# Patient Record
Sex: Female | Born: 1980 | Race: White | Hispanic: No | Marital: Single | Smoking: Never smoker
Health system: Southern US, Community
[De-identification: ages and names within clinical notes are randomized; demographics above are authoritative.]

## PROBLEM LIST (undated history)

## (undated) DIAGNOSIS — K219 Gastro-esophageal reflux disease without esophagitis: Secondary | ICD-10-CM

## (undated) DIAGNOSIS — N719 Inflammatory disease of uterus, unspecified: Secondary | ICD-10-CM

## (undated) DIAGNOSIS — R112 Nausea with vomiting, unspecified: Secondary | ICD-10-CM

## (undated) DIAGNOSIS — D649 Anemia, unspecified: Secondary | ICD-10-CM

## (undated) DIAGNOSIS — Z9889 Other specified postprocedural states: Secondary | ICD-10-CM

## (undated) DIAGNOSIS — M329 Systemic lupus erythematosus, unspecified: Secondary | ICD-10-CM

## (undated) DIAGNOSIS — J329 Chronic sinusitis, unspecified: Secondary | ICD-10-CM

## (undated) DIAGNOSIS — IMO0002 Reserved for concepts with insufficient information to code with codable children: Secondary | ICD-10-CM

## (undated) DIAGNOSIS — I7300 Raynaud's syndrome without gangrene: Secondary | ICD-10-CM

## (undated) DIAGNOSIS — M109 Gout, unspecified: Secondary | ICD-10-CM

## (undated) DIAGNOSIS — D219 Benign neoplasm of connective and other soft tissue, unspecified: Secondary | ICD-10-CM

## (undated) DIAGNOSIS — E039 Hypothyroidism, unspecified: Secondary | ICD-10-CM

## (undated) DIAGNOSIS — M3500 Sicca syndrome, unspecified: Secondary | ICD-10-CM

## (undated) DIAGNOSIS — Z87442 Personal history of urinary calculi: Secondary | ICD-10-CM

## (undated) DIAGNOSIS — T884XXA Failed or difficult intubation, initial encounter: Secondary | ICD-10-CM

## (undated) DIAGNOSIS — Z972 Presence of dental prosthetic device (complete) (partial): Secondary | ICD-10-CM

## (undated) DIAGNOSIS — M1990 Unspecified osteoarthritis, unspecified site: Secondary | ICD-10-CM

## (undated) HISTORY — PX: MAXILLARY SINUS LIFT: SHX5206

## (undated) HISTORY — PX: ESOPHAGOGASTRODUODENOSCOPY: SHX1529

## (undated) HISTORY — PX: CARPAL TUNNEL RELEASE: SHX101

## (undated) HISTORY — PX: LAPAROSCOPY ABDOMEN DIAGNOSTIC: PRO50

## (undated) HISTORY — PX: LITHOTRIPSY: SUR834

## (undated) HISTORY — PX: SINUS EXPLORATION: SHX5214

## (undated) HISTORY — PX: COLONOSCOPY: SHX174

## (undated) HISTORY — PX: KNEE DISLOCATION SURGERY: SHX689

---
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
## 2007-05-20 HISTORY — PX: CARPAL TUNNEL RELEASE: SHX101

## 2013-02-23 LAB — URINALYSIS, COMPLETE
Bilirubin,UR: NEGATIVE
Ketone: NEGATIVE
Nitrite: NEGATIVE
Specific Gravity: 1.024 (ref 1.003–1.030)
Squamous Epithelial: 4

## 2013-02-23 LAB — CBC
HGB: 15.7 g/dL (ref 12.0–16.0)
MCH: 29.5 pg (ref 26.0–34.0)
MCHC: 33.5 g/dL (ref 32.0–36.0)
Platelet: 266 10*3/uL (ref 150–440)
RBC: 5.31 10*6/uL — ABNORMAL HIGH (ref 3.80–5.20)
WBC: 11.9 10*3/uL — ABNORMAL HIGH (ref 3.6–11.0)

## 2013-02-23 LAB — BASIC METABOLIC PANEL
BUN: 22 mg/dL — ABNORMAL HIGH (ref 7–18)
Co2: 25 mmol/L (ref 21–32)
EGFR (African American): >60
EGFR (Non-African Amer.): >60
Potassium: 3.6 mmol/L (ref 3.5–5.1)
Sodium: 137 mmol/L (ref 136–145)

## 2013-02-23 LAB — PREGNANCY, URINE: Pregnancy Test, Urine: NEGATIVE m[IU]/mL

## 2013-02-24 ENCOUNTER — Observation Stay: Payer: Self-pay | Admitting: Internal Medicine

## 2013-02-24 LAB — URINALYSIS, COMPLETE
Bilirubin,UR: NEGATIVE
Glucose,UR: 50 mg/dL (ref 0–75)
Ketone: NEGATIVE
Ph: 5 (ref 4.5–8.0)
RBC,UR: 1 /HPF (ref 0–5)
Specific Gravity: 1.011 (ref 1.003–1.030)
Squamous Epithelial: 1
WBC UR: <1 /HPF (ref 0–5)

## 2013-02-25 LAB — CBC WITH DIFFERENTIAL/PLATELET
Eosinophil #: 0.1 10*3/uL (ref 0.0–0.7)
Eosinophil %: 0.8 %
HCT: 36.1 % (ref 35.0–47.0)
HGB: 12.5 g/dL (ref 12.0–16.0)
Lymphocyte #: 1.1 10*3/uL (ref 1.0–3.6)
Lymphocyte %: 11.7 %
MCH: 30.2 pg (ref 26.0–34.0)
MCHC: 34.7 g/dL (ref 32.0–36.0)
MCV: 87 fL (ref 80–100)
Monocyte #: 0.7 x10 3/mm (ref 0.2–0.9)
Platelet: 155 10*3/uL (ref 150–440)
RDW: 13.8 % (ref 11.5–14.5)

## 2013-02-25 LAB — URINE CULTURE

## 2013-03-01 ENCOUNTER — Encounter (HOSPITAL_COMMUNITY): Payer: Self-pay | Admitting: Emergency Medicine

## 2013-03-01 ENCOUNTER — Emergency Department (HOSPITAL_COMMUNITY)
Admission: EM | Admit: 2013-03-01 | Discharge: 2013-03-02 | Disposition: A | Discharge status: Home / Self Care | Payer: 59 | Attending: Emergency Medicine | Admitting: Emergency Medicine

## 2013-03-01 DIAGNOSIS — Z79899 Other long term (current) drug therapy: Secondary | ICD-10-CM | POA: Insufficient documentation

## 2013-03-01 DIAGNOSIS — H53149 Visual discomfort, unspecified: Secondary | ICD-10-CM | POA: Insufficient documentation

## 2013-03-01 DIAGNOSIS — R519 Headache, unspecified: Secondary | ICD-10-CM

## 2013-03-01 DIAGNOSIS — Z3202 Encounter for pregnancy test, result negative: Secondary | ICD-10-CM | POA: Insufficient documentation

## 2013-03-01 DIAGNOSIS — Z885 Allergy status to narcotic agent status: Secondary | ICD-10-CM | POA: Insufficient documentation

## 2013-03-01 DIAGNOSIS — R0789 Other chest pain: Secondary | ICD-10-CM | POA: Insufficient documentation

## 2013-03-01 DIAGNOSIS — R109 Unspecified abdominal pain: Secondary | ICD-10-CM | POA: Insufficient documentation

## 2013-03-01 DIAGNOSIS — M546 Pain in thoracic spine: Secondary | ICD-10-CM | POA: Insufficient documentation

## 2013-03-01 DIAGNOSIS — Z8742 Personal history of other diseases of the female genital tract: Secondary | ICD-10-CM | POA: Insufficient documentation

## 2013-03-01 DIAGNOSIS — M329 Systemic lupus erythematosus, unspecified: Secondary | ICD-10-CM | POA: Insufficient documentation

## 2013-03-01 DIAGNOSIS — M549 Dorsalgia, unspecified: Secondary | ICD-10-CM

## 2013-03-01 DIAGNOSIS — R51 Headache: Secondary | ICD-10-CM | POA: Insufficient documentation

## 2013-03-01 DIAGNOSIS — M129 Arthropathy, unspecified: Secondary | ICD-10-CM | POA: Insufficient documentation

## 2013-03-01 DIAGNOSIS — Z87440 Personal history of urinary (tract) infections: Secondary | ICD-10-CM | POA: Insufficient documentation

## 2013-03-01 HISTORY — DX: Reserved for concepts with insufficient information to code with codable children: IMO0002

## 2013-03-01 HISTORY — DX: Inflammatory disease of uterus, unspecified: N71.9

## 2013-03-01 HISTORY — DX: Unspecified osteoarthritis, unspecified site: M19.90

## 2013-03-01 HISTORY — DX: Systemic lupus erythematosus, unspecified: M32.9

## 2013-03-01 HISTORY — DX: Benign neoplasm of connective and other soft tissue, unspecified: D21.9

## 2013-03-01 LAB — URINE MICROSCOPIC-ADD ON

## 2013-03-01 LAB — URINALYSIS, ROUTINE W REFLEX MICROSCOPIC
Glucose, UA: NEGATIVE mg/dL
Specific Gravity, Urine: 1.018 (ref 1.005–1.030)
pH: 5 (ref 5.0–8.0)

## 2013-03-01 LAB — POCT PREGNANCY, URINE: Preg Test, Ur: NEGATIVE

## 2013-03-01 NOTE — ED Notes (Signed)
Pt states that she has been seen multiple times for a UTI with no usual symptoms. Pt states that she was even admitted at [facility] for a night with a kidney infection then was given oral antibiotics for a week and finished the antibiotics Monday. Pt states that she started having back pain on Sunday.

## 2013-03-01 NOTE — ED Notes (Signed)
Pt. Upset for the wait time.  Explained we are doing the best we can with our pstients and getting them back.

## 2013-03-02 LAB — CBC WITH DIFFERENTIAL/PLATELET
Basophils Absolute: 0 10*3/uL (ref 0.0–0.1)
Basophils Relative: 0 % (ref 0–1)
HCT: 40.8 % (ref 36.0–46.0)
Hemoglobin: 14.5 g/dL (ref 12.0–15.0)
Lymphs Abs: 0.7 10*3/uL (ref 0.7–4.0)
MCH: 30.7 pg (ref 26.0–34.0)
MCHC: 35.5 g/dL (ref 30.0–36.0)
Monocytes Absolute: 0.2 10*3/uL (ref 0.1–1.0)
Neutro Abs: 8.2 10*3/uL — ABNORMAL HIGH (ref 1.7–7.7)
RDW: 13.8 % (ref 11.5–15.5)

## 2013-03-02 LAB — BASIC METABOLIC PANEL
Calcium: 9.1 mg/dL (ref 8.4–10.5)
Chloride: 102 mEq/L (ref 96–112)
Creatinine, Ser: 0.8 mg/dL (ref 0.50–1.10)
GFR calc Af Amer: >90 mL/min (ref >90)
GFR calc non Af Amer: >90 mL/min (ref >90)
Sodium: 139 mEq/L (ref 135–145)

## 2013-03-02 MED ORDER — MORPHINE SULFATE 4 MG/ML IJ SOLN
4.0000 mg | Freq: Once | INTRAMUSCULAR | Status: AC
  Administered 2013-03-02: 4 mg via INTRAVENOUS
  Filled 2013-03-02: qty 1

## 2013-03-02 MED ORDER — HYDROCODONE-ACETAMINOPHEN 5-325 MG PO TABS: 2.0000 | ORAL_TABLET | Freq: Four times a day (QID) | ORAL | Status: DC | PRN

## 2013-03-02 MED ORDER — SODIUM CHLORIDE 0.9 % IV BOLUS (SEPSIS)
1000.0000 mL | Freq: Once | INTRAVENOUS | Status: AC
  Administered 2013-03-02: 1000 mL via INTRAVENOUS

## 2013-03-02 MED ORDER — ONDANSETRON HCL 4 MG PO TABS: 4.0000 mg | ORAL_TABLET | Freq: Four times a day (QID) | ORAL | Status: DC

## 2013-03-02 MED ORDER — ONDANSETRON HCL 4 MG/2ML IJ SOLN
4.0000 mg | Freq: Once | INTRAMUSCULAR | Status: AC
  Administered 2013-03-02: 4 mg via INTRAVENOUS
  Filled 2013-03-02: qty 2

## 2013-03-02 NOTE — ED Notes (Signed)
Pt crying in room stating that she still has a headache and the morphine did not help. Spoke with pt after talking w Dahlia Client PA and offered to try a different type of pain medication - pt refused. Pt stated several times that she did not want a different type of pain medication. Informed Dahlia Client PA

## 2013-03-02 NOTE — ED Provider Notes (Signed)
CSN: [id number]     Arrival date & time 03/01/13  1958 History   First MD Initiated Contact with Patient 03/02/13 0008     Chief Complaint  Patient presents with  . Urinary Tract Infection  . Back Pain   (Consider location/radiation/quality/duration/timing/severity/associated sxs/prior Treatment) HPI Comments: Patient is a 32 year old female who presents today with worsening back pain. She was recently admitted to [facility] for a pyelonephritis. Prior to her admission she was seen at [facility] and diagnosed with a UTI. She was given 5 days of Cipro which did not help and she was told at [facility] she was resistant to Cipro. Her hospital course at [facility] was uneventful and she was discharged home Thursday. She was sent home on Keflex and toradol. The Toradol has not helped her pain at all. She only takes the Toradol at night. She finished her Keflex on Monday. On Sunday her symptoms began to worsen. She had a gradually worsening, throbbing frontal headache that developed on Saturday. It is associated with mild photophobia. No visual disturbance. She also has bilateral thoracic back pain. This feels like the same pain she had when she had her pyelonephritis. She denies fever, chills, nausea, vomiting, new abdominal pain. She has fibroids and has had the same abdominal pain for years.   The history is provided by the patient. No language interpreter was used.    Past Medical History  Diagnosis Date  . Lupus   . Fibroids   . Arthritis   . Endometritis    Past Surgical History  Procedure Laterality Date  . Carpal tunnel release     No family history on file. History  Substance Use Topics  . Smoking status: Never Smoker   . Smokeless tobacco: Not on file  . Alcohol Use: No   OB History   Grav Para Term Preterm Abortions TAB SAB Ect Mult Living                 Review of Systems  Constitutional: Negative for fever and chills.  Eyes: Positive for photophobia. Negative for visual  disturbance.  Respiratory: Negative for shortness of breath.   Gastrointestinal: Positive for abdominal pain. Negative for nausea and vomiting.  Musculoskeletal: Positive for back pain.  Neurological: Positive for headaches.  All other systems reviewed and are negative.    Allergies  Codeine  Home Medications   Current Outpatient Rx  Name  Route  Sig  Dispense  Refill  . cephALEXin (KEFLEX) 500 MG capsule   Oral   Take 500 mg by mouth 3 (three) times daily.         . hydroxychloroquine (PLAQUENIL) 200 MG tablet   Oral   Take 200 mg by mouth 2 (two) times daily.         Marland Kitchen leflunomide (ARAVA) 10 MG tablet   Oral   Take 10 mg by mouth every evening.         . norethindrone (AYGESTIN) 5 MG tablet   Oral   Take 5 mg by mouth 2 (two) times daily.         . phenazopyridine (PYRIDIUM) 200 MG tablet   Oral   Take 200 mg by mouth 3 (three) times daily.         . potassium citrate (UROCIT-K) 10 MEQ (1080 MG) SR tablet   Oral   Take 20 mEq by mouth 2 (two) times daily.         Marland Kitchen sulfaDIAZINE 500 MG tablet   Oral  Take 1,000 mg by mouth 2 (two) times daily.          BP 115/88  Pulse 92  Temp(Src) 98.3 F (36.8 C) (Oral)  Resp 18  Ht 5\' 1"  (1.549 m)  Wt 135 lb 6 oz (61.406 kg)  BMI 25.59 kg/m2  SpO2 98% Physical Exam  Nursing note and vitals reviewed. Constitutional: She is oriented to person, place, and time. She appears well-developed and well-nourished. No distress.  HENT:  Head: Normocephalic and atraumatic.  Right Ear: External ear normal.  Left Ear: External ear normal.  Nose: Nose normal.  Mouth/Throat: Oropharynx is clear and moist.  Eyes: Conjunctivae and EOM are normal. Pupils are equal, round, and reactive to light.  Neck: Trachea normal, normal range of motion and phonation normal.  No nuchal rigidity or meningeal signs  Cardiovascular: Normal rate, regular rhythm and normal heart sounds.   Pulmonary/Chest: Effort normal and breath  sounds normal. No stridor. No respiratory distress. She has no wheezes. She has no rales.  Abdominal: Soft. She exhibits no distension. There is tenderness in the periumbilical area. There is no rigidity, no rebound and no guarding.  Pt reports abd pain is chronic  Musculoskeletal: Normal range of motion.       Back:  Neurological: She is alert and oriented to person, place, and time. She has normal strength. No sensory deficit. Coordination normal.  Skin: Skin is warm and dry. She is not diaphoretic. No erythema.  Psychiatric: She has a normal mood and affect. Her behavior is normal.    ED Course  Procedures (including critical care time) Labs Review Labs Reviewed  URINALYSIS, ROUTINE W REFLEX MICROSCOPIC - Abnormal; Notable for the following:    APPearance CLOUDY (*)    Hgb urine dipstick SMALL (*)    All other components within normal limits  CBC WITH DIFFERENTIAL - Abnormal; Notable for the following:    Neutrophils Relative % 90 (*)    Neutro Abs 8.2 (*)    Lymphocytes Relative 7 (*)    Monocytes Relative 2 (*)    All other components within normal limits  URINE MICROSCOPIC-ADD ON  BASIC METABOLIC PANEL  POCT PREGNANCY, URINE   Imaging Review No results found.  EKG Interpretation   None      1:48 AM Patient was reevaluated prior to discharge. Morphine and fluids improved back pain, but not headache. Patient refused further medication to improve headache. Patient unhappy and does not believe that her pain is not related to kidney.   MDM   1. Back pain   2. Headache    Patient presents with back pain and headache after being discharged from [place] with pyelonephritis. Symptoms began to worsen over past few days. Completed abx as prescribed. Today UA is clear with only a small amount of blood. No leukocytes or nitrates. Afebrile. No white count. CBC and BMP are unremarkable. Creatinine is not elevated. Patient is tender to palpation over mid back bilaterally. She is  unhappy that there is nothing we can do for her kidneys as she believes this the source of infection. Will d/c home with norco and zofran for back pain. Pt HA treated while in ED with little improvement. Patient refused further treatment.  Presentation is non concerning for [facility], ICH, Meningitis, or temporal arteritis. Pt is afebrile with no focal neuro deficits, nuchal rigidity, or change in vision. Pt is to follow up with PCP. Pt verbalizes understanding and is agreeable with plan to dc. Discussed case with Dr.  Optiz who agrees with plan. Return instructions given. Vital signs stable for discharge. Patient / Family / Caregiver informed of clinical course, understand medical decision-making process, and agree with plan.      Mora Bellman, PA-C 03/02/13 [phone]

## 2013-03-02 NOTE — ED Notes (Signed)
Pt states that she was admitted to [facility] Wednesday and dc'd Friday night and sent home w antibiotics until Monday. Pt states that she started having back pain and headache on Sunday. Pt states that she felt better when she was on the IV antibiotics.

## 2013-03-02 NOTE — ED Provider Notes (Signed)
Medical screening examination/treatment/procedure(s) were conducted as a shared visit with non-physician practitioner(s) and myself.  I personally evaluated the patient during the encounter  Patient with multiple complaints, she is mostly concerned that she has ongoing urinary tract infection 2 to persistent back pain. Chest tenderness over the parathoracic region without any midline tenderness or deformity. No CVA tenderness. Heart regular rate and rhythm and lungs clear to auscultation. No neuro deficits. No abdominal tenderness. Medications provided and patient initially states feeling better, when she was told that a urinalysis and labs are unrevealing for ongoing infection, she became very upset. Patient was offered further medications for headache and back pain. She declines and requests to be discharged. No indication for further workup or admission at this time.  Plan continue antibiotics as prescribed and followup with her doctors in [place].  Results for orders placed during the hospital encounter of 03/01/13  URINALYSIS, ROUTINE W REFLEX MICROSCOPIC      Result Value Range   Color, Urine YELLOW  YELLOW   APPearance CLOUDY (*) CLEAR   Specific Gravity, Urine 1.018  1.005 - 1.030   pH 5.0  5.0 - 8.0   Glucose, UA NEGATIVE  NEGATIVE mg/dL   Hgb urine dipstick SMALL (*) NEGATIVE   Bilirubin Urine NEGATIVE  NEGATIVE   Ketones, ur NEGATIVE  NEGATIVE mg/dL   Protein, ur NEGATIVE  NEGATIVE mg/dL   Urobilinogen, UA 0.2  0.0 - 1.0 mg/dL   Nitrite NEGATIVE  NEGATIVE   Leukocytes, UA NEGATIVE  NEGATIVE  URINE MICROSCOPIC-ADD ON      Result Value Range   Squamous Epithelial / LPF RARE  RARE   WBC, UA 0-2  <3 WBC/hpf   RBC / HPF 3-6  <3 RBC/hpf   Bacteria, UA RARE  RARE  CBC WITH DIFFERENTIAL      Result Value Range   WBC 9.1  4.0 - 10.5 K/uL   RBC 4.72  3.87 - 5.11 MIL/uL   Hemoglobin 14.5  12.0 - 15.0 g/dL   HCT 16.1  09.6 - 04.5 %   MCV 86.4  78.0 - 100.0 fL   MCH 30.7  26.0 - 34.0  pg   MCHC 35.5  30.0 - 36.0 g/dL   RDW 40.9  81.1 - 91.4 %   Platelets 170  150 - 400 K/uL   Neutrophils Relative % 90 (*) 43 - 77 %   Neutro Abs 8.2 (*) 1.7 - 7.7 K/uL   Lymphocytes Relative 7 (*) 12 - 46 %   Lymphs Abs 0.7  0.7 - 4.0 K/uL   Monocytes Relative 2 (*) 3 - 12 %   Monocytes Absolute 0.2  0.1 - 1.0 K/uL   Eosinophils Relative 0  0 - 5 %   Eosinophils Absolute 0.0  0.0 - 0.7 K/uL   Basophils Relative 0  0 - 1 %   Basophils Absolute 0.0  0.0 - 0.1 K/uL  BASIC METABOLIC PANEL      Result Value Range   Sodium 139  135 - 145 mEq/L   Potassium 4.2  3.5 - 5.1 mEq/L   Chloride 102  96 - 112 mEq/L   CO2 23  19 - 32 mEq/L   Glucose, Bld 96  70 - 99 mg/dL   BUN 18  6 - 23 mg/dL   Creatinine, Ser 7.82  0.50 - 1.10 mg/dL   Calcium 9.1  8.4 - 95.6 mg/dL   GFR calc non Af Amer >90  >90 mL/min   GFR calc  Af Amer >90  >90 mL/min  POCT PREGNANCY, URINE      Result Value Range   Preg Test, Ur NEGATIVE  NEGATIVE        Sunnie Nielsen, MD 03/02/13 [phone]

## 2013-03-08 ENCOUNTER — Encounter: Payer: Self-pay | Admitting: Family Medicine

## 2013-03-08 ENCOUNTER — Ambulatory Visit (INDEPENDENT_AMBULATORY_CARE_PROVIDER_SITE_OTHER): Payer: 59 | Admitting: Family Medicine

## 2013-03-08 VITALS — BP 124/78 | HR 123 | Ht 61.0 in | Wt 136.0 lb

## 2013-03-08 DIAGNOSIS — N809 Endometriosis, unspecified: Secondary | ICD-10-CM | POA: Insufficient documentation

## 2013-03-08 DIAGNOSIS — N200 Calculus of kidney: Secondary | ICD-10-CM

## 2013-03-08 DIAGNOSIS — C490 Malignant neoplasm of connective and soft tissue of head, face and neck: Secondary | ICD-10-CM

## 2013-03-08 DIAGNOSIS — M329 Systemic lupus erythematosus, unspecified: Secondary | ICD-10-CM

## 2013-03-08 DIAGNOSIS — M069 Rheumatoid arthritis, unspecified: Secondary | ICD-10-CM | POA: Insufficient documentation

## 2013-03-08 DIAGNOSIS — M3500 Sicca syndrome, unspecified: Secondary | ICD-10-CM

## 2013-03-08 NOTE — Patient Instructions (Signed)
Hysterectomy Information  A hysterectomy is a procedure where your uterus is surgically removed. It will no longer be possible to have menstrual periods or to become pregnant. The tubes and ovaries can be removed (bilateral salpingo-oopherectomy) during this surgery as well.  REASONS FOR A HYSTERECTOMY  Persistent, abnormal bleeding.  Lasting (chronic) pelvic pain or infection.  The lining of the uterus (endometrium) starts growing outside the uterus (endometriosis).  The endometrium starts growing in the muscle of the uterus (adenomyosis).  The uterus falls down into the vagina (pelvic organ prolapse).  Symptomatic uterine fibroids.  Precancerous cells.  Cervical cancer or uterine cancer. TYPES OF HYSTERECTOMIES  Supracervical hysterectomy. This type removes the top part of the uterus, but not the cervix.  Total hysterectomy. This type removes the uterus and cervix.  Radical hysterectomy. This type removes the uterus, cervix, and the fibrous tissue that holds the uterus in place in the pelvis (parametrium). WAYS A HYSTERECTOMY CAN BE PERFORMED  Abdominal hysterectomy. A large surgical cut (incision) is made in the abdomen. The uterus is removed through this incision.  Vaginal hysterectomy. An incision is made in the vagina. The uterus is removed through this incision. There are no abdominal incisions.  Conventional laparoscopic hysterectomy. A thin, lighted tube with a camera (laparoscope) is inserted into 3 or 4 small incisions in the abdomen. The uterus is cut into small pieces. The small pieces are removed through the incisions, or they are removed through the vagina.  Laparoscopic assisted vaginal hysterectomy (LAVH). Three or four small incisions are made in the abdomen. Part of the surgery is performed laparoscopically and part vaginally. The uterus is removed through the vagina.  Robot-assisted laparoscopic hysterectomy. A laparoscope is inserted into 3 or 4 small  incisions in the abdomen. A computer-controlled device is used to give the surgeon a 3D image. This allows for more precise movements of surgical instruments. The uterus is cut into small pieces and removed through the incisions or removed through the vagina. RISKS OF HYSTERECTOMY   Bleeding and risk of blood transfusion. Tell your caregiver if you do not want to receive any blood products.  Blood clots in the legs or lung.  Infection.  Injury to surrounding organs.  Anesthesia problems or side effects.  Conversion to an abdominal hysterectomy. WHAT TO EXPECT AFTER A HYSTERECTOMY  You will be given pain medicine.  You will need to have someone with you for the first 3 to 5 days after you go home.  You will need to follow up with your surgeon in 2 to 4 weeks after surgery to evaluate your progress.  You may have early menopause symptoms like hot flashes, night sweats, and insomnia.  If you had a hysterectomy for a problem that was not a cancer or a condition that could lead to cancer, then you no longer need Pap tests. However, even if you no longer need a Pap test, a regular exam is a good idea to make sure no other problems are starting. Document Released: 10/29/2000 Document Revised: 07/28/2011 Document Reviewed: 12/14/2010 [facility] Patient Information 2014 [place], [place]. Endometriosis Endometriosis is a disease that occurs when the endometrium (lining of the uterus) is misplaced outside of its normal location. It may occur in many locations close to the uterus (womb), but commonly on the ovaries, fallopian tubes, vagina (birth canal) and bowel located close to the uterus. Because the uterus sloughs (expels) its lining every month (menses), there is bleeding whereever the endometrial tissue is located. SYMPTOMS  Often  there are no symptoms. However, because blood is irritating to tissues not normally exposed to it, when symptoms occur they vary with the location of the misplaced  endometrium. Symptoms often include back and abdominal pain. Periods may be heavier and intercourse may be painful. Infertility may be present. You may have all of these symptoms at one time or another or you may have months with no symptoms at all. Although the symptoms occur mainly during menses, they can occur mid-cycle as well, and usually terminate with menopause. DIAGNOSIS  Your caregiver may recommend a blood test and urine test (urinalysis) to help rule out other conditions. Another common test is ultrasound, a painless procedure that uses sound waves to make a sonogram "picture" of abnormal tissue that could be endometriosis. If your bowel movements are painful around your periods, your caregiver may advise a barium enema (an X-ray of the lower bowel), to try to find the source of your pain. This is sometimes confirmed by laparoscopy. Laparoscopy is a procedure where your caregiver looks into your abdomen with a laparoscope (a small pencil sized telescope). Your caregiver may take a tiny piece of tissue (biopsy) from any abnormal tissue to confirm or document your problem. These tissues are sent to the lab and a pathologist looks at them under the microscope to give a microscopic diagnosis. TREATMENT  Once the diagnosis is made, it can be treated by destruction of the misplaced endometrial tissue using heat (diathermy), laser, cutting (excision), or chemical means. It may also be treated with hormonal therapy. When using hormonal therapy menses are eliminated, therefore eliminating the monthly exposure to blood by the misplaced endometrial tissue. Only in severe cases is it necessary to perform a hysterectomy with removal of the tubes, uterus and ovaries. HOME CARE INSTRUCTIONS   Only take over-the-counter or prescription medicines for pain, discomfort, or fever as directed by your caregiver.  Avoid activities that produce pain, including a physical sexual relationship.  Do not take aspirin as  this may increase bleeding when not on hormonal therapy.  See your caregiver for pain or problems not controlled with treatment. SEEK IMMEDIATE MEDICAL CARE IF:   Your pain is severe and is not responding to pain medicine.  You develop severe nausea and vomiting, or you cannot keep foods down.  Your pain localizes to the right lower part of your abdomen (possible appendicitis).  You have swelling or increasing pain in the abdomen.  You have a fever.  You see blood in your stool. MAKE SURE YOU:   Understand these instructions.  Will watch your condition.  Will get help right away if you are not doing well or get worse. Document Released: 05/02/2000 Document Revised: 07/28/2011 Document Reviewed: 12/22/2007 [facility] Patient Information 2014 [place], [place].

## 2013-03-08 NOTE — Assessment & Plan Note (Signed)
>  25 min discussion had with pt. About pros and cons of surgery.  Discussed definitive treatment surgical +/- removal of ovaries, open vs. Robotic, possible injury to surrounding organs given previous surgical findings, difficulty healing related to chronic immunosuppression, menopause, osteoporosis.  Discussed alternative therapies including IUD vs. continuous OC's vs. Continuing or increasing her aygestin.  She will think this over and let us know what she decides to do.

## 2013-03-09 NOTE — Progress Notes (Signed)
Subjective:    Patient ID: Barbara Williamson, female    DOB: 03/16/1981, 32 y.o.   MRN: [id number]  HPI New pt. Here today for second opinion.  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  Surviving that, menses began at age 37 and have been irregular.  Only lasting 2-3 days but painful.  Has h/o SLE, RA and Sjogren's, on some immunosuppression and has pain in joints mostly, possibly some liver involvement.  Has h/o nephrolithiasis and during an attack, had CT which showed large ovarian cyst.  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  She was given Depo-Lupron for one year, followed by continuous OC's.  Noted pain returned after this.  Subsequently had reduction  In lower abdominal pain, and then moved here from [place].  Saw MD at [facility] and underwent pelvic sonography, which showed resolution of ovarian cyst and few fibroids. Last 2 cycles in Sept and October have also had heavy vaginal bleeding.  She was then started on Aygestin. Pain continues and she is wondering about definitive treatment.  She has a lot of pain and would like some pain to be eliminated if possible. Pt. Is an excellent historian and I have reviewed notes from [place].  Past Medical History  Diagnosis Date  . Lupus   . Fibroids   . Arthritis   . Endometritis    Past Surgical History  Procedure Laterality Date  . Carpal tunnel release    . Laparoscopy abdomen diagnostic    . Tumor removal Left 1986    rhabdomyosarcoma  . Cataract extraction w/ intraocular lens implant Left 1989  . Maxillary sinus lift      x2  . Sinus exploration Left     x 2  . Knee dislocation surgery Right     X 2  . Lithotripsy     Current Outpatient Prescriptions on File  Prior to Visit  Medication Sig Dispense Refill  . hydroxychloroquine (PLAQUENIL) 200 MG tablet Take 200 mg by mouth 2 (two) times daily.      Marland Kitchen leflunomide (ARAVA) 10 MG tablet Take 10 mg by mouth every evening.      . norethindrone (AYGESTIN) 5 MG tablet Take 5 mg by mouth 2 (two) times daily.      . potassium citrate (UROCIT-K) 10 MEQ (1080 MG) SR tablet Take 20 mEq by mouth 2 (two) times daily.      Marland Kitchen sulfaDIAZINE 500 MG tablet Take 1,000 mg by mouth 2 (two) times daily.      Marland Kitchen HYDROcodone-acetaminophen (NORCO/VICODIN) 5-325 MG per tablet Take 2 tablets by mouth every 6 (six) hours as needed for pain.  12 tablet  0   No current facility-administered medications on file prior to visit.   Allergies  Allergen Reactions  . Codeine Nausea And Vomiting   Family History  Problem Relation Age of Onset  . Cancer Maternal Aunt     breast   History   Social History  . Marital Status: Single    Spouse Name: N/A    Number of Children: N/A  . Years of Education: N/A   Occupational History  . Not on file.   Social History Main Topics  . Smoking status: Never Smoker   . Smokeless tobacco: Never Used  .  Alcohol Use: No  . Drug Use: No  . Sexual Activity: No   Other Topics Concern  . Not on file   Social History Narrative  . No narrative on file     Review of Systems  Constitutional: Positive for fatigue. Negative for fever and chills.  Respiratory: Negative for shortness of breath and wheezing.   Cardiovascular: Negative for chest pain and leg swelling.  Gastrointestinal: Positive for abdominal pain. Negative for nausea, vomiting, diarrhea and constipation.  Endocrine: Negative for cold intolerance and heat intolerance.  Genitourinary: Positive for vaginal bleeding, menstrual problem and pelvic pain. Negative for dysuria.  Musculoskeletal: Positive for arthralgias.  Skin: Negative for rash.  Neurological: Negative for dizziness, seizures and speech difficulty.   Psychiatric/Behavioral: Negative for behavioral problems and agitation.       Objective:   Physical Exam  Vitals reviewed. Constitutional: She appears well-developed and well-nourished. No distress.  HENT:  Head: Normocephalic and atraumatic.  Eyes: No scleral icterus.  Neck: Neck supple.  Cardiovascular: Normal rate.   Pulmonary/Chest: Effort normal.  Abdominal: Soft. There is no tenderness.  Musculoskeletal:  She has facial asymmetry related to previous resection and XRT  Neurological: She is alert.  Skin: Skin is warm.  Psychiatric: She has a normal mood and affect.          Assessment & Plan:

## 2013-03-29 HISTORY — PX: ABDOMINAL HYSTERECTOMY: SHX81

## 2014-09-08 NOTE — Discharge Summary (Signed)
PATIENT NAME:  Barbara Williamson, Barbara Williamson MR#:  [id number] DATE OF BIRTH:  02-21-1981  DATE OF ADMISSION:  02/24/2013 DATE OF DISCHARGE:  02/25/2013  ADMITTING DIAGNOSIS: Urinary tract infection.   DISCHARGE DIAGNOSES: 1.  Suspected acute pyelonephritis. Cultures unfortunately contamination only.  2.  Hypotension, resolved on intravenous fluids. 3.  Leukocytosis, resolved.  4.  Chronic abdominal pain, likely due to uterine fibroids as well as endometriosis. 5.  History of kidney stones, nonobstructing.  6.  Recent exacerbation of lupus, rheumatoid arthritis.   DISCHARGE CONDITION: Stable.   DISCHARGE MEDICATIONS: The patient is to continue Plaquenil 200 mg p.o. twice daily, leflunomide 20 mg p.o. daily, norethindrone 5 mg once daily, prednisone 10 mg p.o. daily, potassium citrate 2 tablets twice daily, Prilosec 20 mg p.o. twice daily.   NEW MEDICATIONS: Acetaminophen/hydrocodone 325/5 two tablets every 4 hours as needed, Pyridium 100 mg p.o. 3 times daily after meals for 2 more days and Keflex 500 mg p.o. 3 times daily for 5 more days.   HOME OXYGEN: None.   DIET: Regular, regular consistency.   ACTIVITY LIMITATIONS: As tolerated.   FOLLOWUP APPOINTMENT: With primary care physician at [facility] in [facility] in 2 days after discharge. The patient will be also scheduled a followup appointment with Dr. Enzo Bi in 1 to 2 weeks after discharge, as well as Dr. Jacqlyn Larsen in the next available in the next 1 to 2 weeks after discharge.   CONSULTANTS: Care management, social work.   RADIOLOGIC STUDIES: CT scan of abdomen and pelvis with contrast, 02/23/2013, revealed findings likely representing multiple uterine leiomyomata, no definite evidence of obstructive or inflammatory abnormalities. Nonobstructing renal calculi on the right were noted.   The patient is a 34 year old Caucasian female with past medical history significant for history of rheumatoid arthritis, history of lupus, who presented to  the hospital with complaints of dysuria. Please refer to Dr. Samantha Crimes admission note on 02/24/2013. Apparently, the patient was diagnosed with a urinary tract infection approximately 1 week ago. She was given Cipro p.o. and despite her taking medications, she continued to have dysuria as well as increased urinary frequency and bilateral flank and suprapubic pains, so she presented to the hospital for further evaluation. In the Emergency Room, her vital signs: Temperature was 98. Pulse was 84. Respiratory rate was 16, blood pressure 111/74. O2 sats were 99% on room air. Physical exam did not show any significant abnormalities. Mild guarding was noted in suprapubic region.   The patient's lab data done on admission, 02/23/2013, revealed mild elevation of BUN to 22. Otherwise BMP was normal. The patient's white blood cell count was also slightly elevated at 11.9. Hemoglobin was 15.7 and platelet count was 266. Urinalysis revealed yellow hazy urine, negative for glucose, bilirubin or ketones. Specific gravity was 1.024; pH was 5.0, 2+ blood, 30 mg/dL protein, negative for nitrites; 2+ leukocyte esterase, 57 red blood cells, 78 white blood cells were noted, no bacteria; 4 epithelial cells as well as mucus was present. Urine cultures revealed mixed bacterial organisms, results suggestive of contamination. Pregnancy test in urine was negative.   The hospital was admitted to the hospital for further evaluation. She was started on Rocephin IV and with this, the patient's condition somewhat improved. She was also given some pain medications for abdominal discomfort as well as Pyridium. By 02/25/2013, she felt satisfactory and was okay to be discharged home. Her vitals remained stable with temperature of 98.2. Pulse was 70s to 80s. Respiratory rate was 18, blood pressure ranging  from 829 to 562 systolic and 13Y diastolic, and O2 sats were 94% to 96% on room air at rest. The patient was advised to continue antibiotic therapy  for 5 more days and follow up with her primary care physician for further recommendations. The patient was noted to be hypotensive initially on arrival to the hospital. She was given IV fluids and her blood pressure improved. In regards to leukocytosis, with antibiotic therapy, the patient's leukocytosis resolved. For chronic abdominal pains, it was felt to be due to uterine fibroids as well as endometriosis. The patient will be scheduled an appointment with Dr. Enzo Bi as outpatient. In regards to kidney stones, the patient will be scheduled also with a urologist as outpatient. No obstruction was noted. The patient is being discharged in stable condition with the above-mentioned medications and followup.   TIME SPENT: 40 minutes.   ____________________________ Theodoro Grist, MD rv:jm D: 02/25/2013 17:34:07 ET T: 02/25/2013 19:29:12 ET JOB#: [id number]  cc: Theodoro Grist, MD, <Dictator> [facility] Rodolph Bong MD ELECTRONICALLY SIGNED 03/01/2013 12:34

## 2014-09-08 NOTE — H&P (Signed)
PATIENT NAME:  LANDON, TRUAX MR#:  [id number] DATE OF BIRTH:  May 28, 1980  DATE OF ADMISSION:  02/23/2013  REFERRING PHYSICIAN: Dr. Benjaman Lobe.   PRIMARY CARE PHYSICIAN: [facility] out of [place].   CHIEF COMPLAINT: Dysuria.   HISTORY OF PRESENT ILLNESS: Ms. Sedberry is a 34 year old Caucasian female with past medical history of lupus and rheumatoid arthritis who is presenting with dysuria and flank pain. She actually presented to the [facility] Department approximately 1 week ago with similar symptoms, diagnosed with a UTI and given Cipro p.o. Despite these antibiotics, which she continued to take, she had continuous dysuria, increased urinary frequency, as well as bilateral flank and suprapubic pain. She described her pain as a burning sensation, rating between 5 and 7 out of 10 in intensity without radiation. No worsening or relieving factors. She also has noted occasional hematuria but denied any pyuria. She also noted having subjective chills but denied any fevers. Currently, she is without complaint.    REVIEW OF SYSTEMS:  CONSTITUTIONAL: Denies any fever; however, does mention pain as above.  EYES: Denies any blurred vision or eye pain.  ENT: Denies any tinnitus, ear pain, hearing loss or dysphagia.  RESPIRATORY: Denies any cough, wheezing, shortness of breath.  CARDIOVASCULAR: Denies any chest pain, palpitations. She does mention lower extremity edema which is chronic in nature.  GASTROINTESTINAL: She denies any nausea, vomiting, diarrhea. Has suprapubic pain as mentioned above.  GENITOURINARY: Has dysuria and hematuria as well as increased urinary frequency as mentioned above.  ENDOCRINE: Denies any nocturia or thyroid problems.  HEMATOLOGIC AND LYMPHATIC: Denies any easy bruising or bleeding.  SKIN: Denies any rash or lesions.  MUSCULOSKELETAL: Denies any arthritis or swelling.  NEUROLOGIC: Denies any paralysis, paresthesias.  PSYCHIATRIC: Denies any anxiety or depressive  symptoms.   PAST MEDICAL HISTORY: Rheumatoid arthritis diagnosed 2 years ago, lupus diagnosed 3 years ago. Both currently well controlled.   SOCIAL HISTORY: Denies any tobacco, alcohol or drug usage.   FAMILY HISTORY: Mother with lupus which is also well controlled.   ALLERGIES: CODEINE.   HOME MEDICATIONS: Include leflunomide 20 mg p.o. daily, norethindrone 5 mg p.o. daily, Plaquenil 200 mg p.o. b.i.d., potassium citrate 2 tabs p.o. b.i.d., prednisone 20 mg p.o. daily, Prilosec 20 mg p.o. b.i.d.  PHYSICAL EXAMINATION:  VITAL SIGNS: Temperature 98 degrees Fahrenheit, pulse 84, respirations 16, blood pressure 111/74, saturating 99% on room air. Weight 59 kg, BMI 24.6.  GENERAL: Well-nourished, well-developed, Caucasian female in no acute distress.  HEAD: Normocephalic, atraumatic.  EYES: Pupils equal, round and reactive to light. Extraocular muscles intact. No scleral icterus.  MOUTH: Moist membranes. Dentition intact. No lesions.  EARS, NOSE AND THROAT: Throat is clear without exudate. No external lesions.  NECK: Supple. No thyromegaly. No nodules. No JVD.  PULMONARY: Clear to auscultation bilaterally without wheezes, rubs or rhonchi. No use of accessory muscles. Good respiratory effort bilaterally.  CHEST: Nontender to palpation.  CARDIOVASCULAR: S1, S2, regular rate and rhythm. No murmurs, rubs or gallops. Trace lower extremity edema. Pedal pulses 2+ bilaterally.  GASTROINTESTINAL: Soft, nondistended. No masses. No hepatosplenomegaly. Positive bowel sounds. Mild tenderness to palpation over suprapubic region without rebound tenderness or guarding or motion tenderness.  MUSCULOSKELETAL: No swelling, clubbing or edema. Range of motion full in all extremities. Bilateral costovertebral angle tenderness present bilaterally.  NEUROLOGIC: Cranial nerves II through XII intact. Sensation intact. Reflexes intact.  SKIN: No ulcerations, lesions or rashes. Skin is warm and dry. Turgor intact.   PSYCHIATRIC: Mood and affect within  normal limits. Alert, oriented x3. Insight and judgment intact.   LABORATORY DATA: Sodium 137, potassium 3.6, chloride 104, bicarb 25, BUN 22, creatinine 0.88, glucose 88. WBC 11.9, hemoglobin 15.7, platelets 266. Urinalysis: Leukocyte esterase 2+, nitrite negative, protein 30 mg/dL, blood 2+, RBCs 57, WBCs 78, epithelial cells 4. Urine hCG negative.   ASSESSMENT AND PLAN: A 34 year old Caucasian female with history of lupus and rheumatoid arthritis, presenting with dysuria and flank pain who has failed outpatient treatment for urinary tract infection.  1. Urinary tract infection: Failed outpatient therapy. Follow urine culture. Give ceftriaxone. Check urine for casts on the off chance this is related to lupus nephritis.  2. Systemic lupus erythematosus and rheumatoid arthritis: Continue Plaquenil, prednisone, leflunomide. If condition worsens, would dose stress dose steroids.  3. Venous thromboembolism prophylaxis: Sequential compression devices.   The patient is FULL CODE.   TIME SPENT: 45 minutes.    ____________________________ Aaron Mose. Mionna Advincula, MD dkh:gb D: 02/24/2013 01:09:36 ET T: 02/24/2013 01:25:03 ET JOB#: [id number]  cc: Aaron Mose. Cartel Mauss, MD, <Dictator> Malanie Koloski Woodfin Ganja MD ELECTRONICALLY SIGNED 02/24/2013 3:57

## 2015-07-16 HISTORY — PX: TOTAL THYROIDECTOMY: SHX2547

## 2019-01-06 HISTORY — PX: LESION REMOVAL: SHX5196

## 2019-07-05 DIAGNOSIS — U071 COVID-19: Secondary | ICD-10-CM

## 2019-07-05 HISTORY — DX: COVID-19: U07.1

## 2020-03-07 ENCOUNTER — Other Ambulatory Visit: Payer: Self-pay | Admitting: Podiatry

## 2020-03-15 ENCOUNTER — Other Ambulatory Visit: Payer: Self-pay

## 2020-03-15 ENCOUNTER — Encounter: Payer: Self-pay | Admitting: Podiatry

## 2020-03-16 NOTE — Discharge Instructions (Signed)
[facility]  POST OPERATIVE INSTRUCTIONS FOR DR. TROXLER, DR. FOWLER, AND DR. [facility]   1. Take your medication as prescribed.  Pain medication should be taken only as needed.  2. Keep the dressing clean, dry and intact.  3. Keep your foot elevated above the heart level for the first 48 hours.  4. Walking to the bathroom and brief periods of walking are acceptable, unless we have instructed you to be non-weight bearing.  5. Always wear your post-op shoe when walking.  Always use your crutches if you are to be non-weight bearing.  6. Do not take a shower. Baths are permissible as long as the foot is kept out of the water.   7. Every hour you are awake:  - Bend your knee 15 times. - Flex foot 15 times - Massage calf 15 times  8. Call [facility] ([phone]) if any of the following problems occur: - You develop a temperature or fever. - The bandage becomes saturated with blood. - Medication does not stop your pain. - Injury of the foot occurs. - Any symptoms of infection including redness, odor, or red streaks running from wound.   General Anesthesia, Adult, Care After This sheet gives you information about how to care for yourself after your procedure. Your health care provider may also give you more specific instructions. If you have problems or questions, contact your health care provider. What can I expect after the procedure? After the procedure, the following side effects are common:  Pain or discomfort at the IV site.  Nausea.  Vomiting.  Sore throat.  Trouble concentrating.  Feeling cold or chills.  Weak or tired.  Sleepiness and fatigue.  Soreness and body aches. These side effects can affect parts of the body that were not involved in surgery. Follow these instructions at home:  For at least 24 hours after the procedure:  Have a responsible adult stay with you. It is  important to have someone help care for you until you are awake and alert.  Rest as needed.  Do not: ? Participate in activities in which you could fall or become injured. ? Drive. ? Use heavy machinery. ? Drink alcohol. ? Take sleeping pills or medicines that cause drowsiness. ? Make important decisions or sign legal documents. ? Take care of children on your own. Eating and drinking  Follow any instructions from your health care provider about eating or drinking restrictions.  When you feel hungry, start by eating small amounts of foods that are soft and easy to digest (bland), such as toast. Gradually return to your regular diet.  Drink enough fluid to keep your urine pale yellow.  If you vomit, rehydrate by drinking water, juice, or clear broth. General instructions  If you have sleep apnea, surgery and certain medicines can increase your risk for breathing problems. Follow instructions from your health care provider about wearing your sleep device: ? Anytime you are sleeping, including during daytime naps. ? While taking prescription pain medicines, sleeping medicines, or medicines that make you drowsy.  Return to your normal activities as told by your health care provider. Ask your health care provider what activities are safe for you.  Take over-the-counter and prescription medicines only as told by your health care provider.  If you smoke, do not smoke without supervision.  Keep all follow-up visits as told by your health care provider. This is important. Contact a health care provider if:    You have nausea or vomiting that does not get better with medicine.  You cannot eat or drink without vomiting.  You have pain that does not get better with medicine.  You are unable to pass urine.  You develop a skin rash.  You have a fever.  You have redness around your IV site that gets worse. Get help right away if:  You have difficulty breathing.  You have chest  pain.  You have blood in your urine or stool, or you vomit blood. Summary  After the procedure, it is common to have a sore throat or nausea. It is also common to feel tired.  Have a responsible adult stay with you for the first 24 hours after general anesthesia. It is important to have someone help care for you until you are awake and alert.  When you feel hungry, start by eating small amounts of foods that are soft and easy to digest (bland), such as toast. Gradually return to your regular diet.  Drink enough fluid to keep your urine pale yellow.  Return to your normal activities as told by your health care provider. Ask your health care provider what activities are safe for you. This information is not intended to replace advice given to you by your health care provider. Make sure you discuss any questions you have with your health care provider. Document Revised: 05/08/2017 Document Reviewed: 12/19/2016 Elsevier Patient Education  2020 [employer] Inc[employer]  

## 2020-03-19 ENCOUNTER — Other Ambulatory Visit
Admission: RE | Admit: 2020-03-19 | Discharge: 2020-03-19 | Disposition: A | Discharge status: Home / Self Care | Payer: Managed Care, Other (non HMO) | Source: Ambulatory Visit | Attending: Podiatry | Admitting: Podiatry

## 2020-03-19 ENCOUNTER — Other Ambulatory Visit: Payer: Self-pay

## 2020-03-19 DIAGNOSIS — Z20822 Contact with and (suspected) exposure to covid-19: Secondary | ICD-10-CM | POA: Diagnosis not present

## 2020-03-19 DIAGNOSIS — Z01812 Encounter for preprocedural laboratory examination: Secondary | ICD-10-CM | POA: Insufficient documentation

## 2020-03-19 LAB — SARS CORONAVIRUS 2 (TAT 6-24 HRS): SARS Coronavirus 2: NEGATIVE

## 2020-03-21 ENCOUNTER — Ambulatory Visit: Payer: Managed Care, Other (non HMO) | Admitting: Anesthesiology

## 2020-03-21 ENCOUNTER — Encounter
Admission: RE | Disposition: A | Discharge status: Home / Self Care | Payer: Self-pay | Source: Home / Self Care | Attending: Podiatry

## 2020-03-21 ENCOUNTER — Other Ambulatory Visit: Payer: Self-pay

## 2020-03-21 ENCOUNTER — Ambulatory Visit
Admission: RE | Admit: 2020-03-21 | Discharge: 2020-03-21 | Disposition: A | Discharge status: Home / Self Care | Payer: Managed Care, Other (non HMO) | Attending: Podiatry | Admitting: Podiatry

## 2020-03-21 ENCOUNTER — Encounter: Payer: Self-pay | Admitting: Podiatry

## 2020-03-21 DIAGNOSIS — S93124S Dislocation of metatarsophalangeal joint of right lesser toe(s), sequela: Secondary | ICD-10-CM | POA: Insufficient documentation

## 2020-03-21 DIAGNOSIS — L97519 Non-pressure chronic ulcer of other part of right foot with unspecified severity: Secondary | ICD-10-CM | POA: Insufficient documentation

## 2020-03-21 DIAGNOSIS — X58XXXS Exposure to other specified factors, sequela: Secondary | ICD-10-CM | POA: Insufficient documentation

## 2020-03-21 HISTORY — DX: Sjogren syndrome, unspecified: M35.00

## 2020-03-21 HISTORY — DX: Presence of dental prosthetic device (complete) (partial): Z97.2

## 2020-03-21 HISTORY — DX: Anemia, unspecified: D64.9

## 2020-03-21 HISTORY — DX: Gastro-esophageal reflux disease without esophagitis: K21.9

## 2020-03-21 HISTORY — DX: Gout, unspecified: M10.9

## 2020-03-21 HISTORY — DX: Hypothyroidism, unspecified: E03.9

## 2020-03-21 HISTORY — DX: Failed or difficult intubation, initial encounter: T88.4XXA

## 2020-03-21 HISTORY — DX: Personal history of urinary calculi: Z87.442

## 2020-03-21 HISTORY — DX: Chronic sinusitis, unspecified: J32.9

## 2020-03-21 HISTORY — DX: Other specified postprocedural states: R11.2

## 2020-03-21 HISTORY — PX: AMPUTATION TOE: SHX6595

## 2020-03-21 HISTORY — DX: Other specified postprocedural states: Z98.890

## 2020-03-21 HISTORY — DX: Raynaud's syndrome without gangrene: I73.00

## 2020-03-21 SURGERY — AMPUTATION, TOE
Anesthesia: General | Site: Toe | Laterality: Right

## 2020-03-21 MED ORDER — PROPOFOL 500 MG/50ML IV EMUL
INTRAVENOUS | Status: DC | PRN
  Administered 2020-03-21: 50 ug/kg/min via INTRAVENOUS

## 2020-03-21 MED ORDER — LACTATED RINGERS IV SOLN: INTRAVENOUS | Status: DC

## 2020-03-21 MED ORDER — FENTANYL CITRATE (PF) 100 MCG/2ML IJ SOLN: 25.0000 ug | INTRAMUSCULAR | Status: DC | PRN

## 2020-03-21 MED ORDER — FENTANYL CITRATE (PF) 100 MCG/2ML IJ SOLN
INTRAMUSCULAR | Status: DC | PRN
  Administered 2020-03-21: 50 ug via INTRAVENOUS
  Administered 2020-03-21: 25 ug via INTRAVENOUS

## 2020-03-21 MED ORDER — CEFAZOLIN SODIUM-DEXTROSE 2-4 GM/100ML-% IV SOLN
2.0000 g | INTRAVENOUS | Status: AC
  Administered 2020-03-21: 2 g via INTRAVENOUS

## 2020-03-21 MED ORDER — OXYCODONE-ACETAMINOPHEN 5-325 MG PO TABS
1.0000 | ORAL_TABLET | ORAL | 0 refills | Status: DC | PRN
Start: 2020-03-21 — End: 2021-01-16

## 2020-03-21 MED ORDER — LIDOCAINE HCL (CARDIAC) PF 100 MG/5ML IV SOSY
PREFILLED_SYRINGE | INTRAVENOUS | Status: DC | PRN
  Administered 2020-03-21: 50 mg via INTRAVENOUS

## 2020-03-21 MED ORDER — MIDAZOLAM HCL 2 MG/2ML IJ SOLN
INTRAMUSCULAR | Status: DC | PRN
  Administered 2020-03-21: .5 mg via INTRAVENOUS
  Administered 2020-03-21: 1 mg via INTRAVENOUS

## 2020-03-21 MED ORDER — LIDOCAINE HCL (PF) 1 % IJ SOLN
INTRAMUSCULAR | Status: DC | PRN
  Administered 2020-03-21: 5 mL

## 2020-03-21 MED ORDER — POVIDONE-IODINE 10 % EX SWAB: 2.0000 "application " | Freq: Once | CUTANEOUS | Status: DC

## 2020-03-21 MED ORDER — BUPIVACAINE HCL (PF) 0.5 % IJ SOLN
INTRAMUSCULAR | Status: DC | PRN
  Administered 2020-03-21 (×2): 5 mL

## 2020-03-21 MED ORDER — LACTATED RINGERS IV SOLN: INTRAVENOUS | Status: DC | PRN

## 2020-03-21 MED ORDER — ONDANSETRON HCL 4 MG/2ML IJ SOLN
INTRAMUSCULAR | Status: DC | PRN
  Administered 2020-03-21: 4 mg via INTRAVENOUS

## 2020-03-21 SURGICAL SUPPLY — 31 items
APL SKNCLS STERI-STRIP NONHPOA (GAUZE/BANDAGES/DRESSINGS) ×1
BENZOIN TINCTURE PRP APPL 2/3 (GAUZE/BANDAGES/DRESSINGS) ×2 IMPLANT
BNDG CMPR 75X41 PLY HI ABS (GAUZE/BANDAGES/DRESSINGS) ×1
BNDG COHESIVE 4X5 TAN STRL (GAUZE/BANDAGES/DRESSINGS) ×2 IMPLANT
BNDG ELASTIC 4X5.8 VLCR STR LF (GAUZE/BANDAGES/DRESSINGS) ×1 IMPLANT
BNDG ESMARK 6X12 TAN STRL LF (GAUZE/BANDAGES/DRESSINGS) ×2 IMPLANT
BNDG GAUZE 4.5X4.1 6PLY STRL (MISCELLANEOUS) ×2 IMPLANT
BNDG STRETCH 4X75 STRL LF (GAUZE/BANDAGES/DRESSINGS) ×2 IMPLANT
CUFF TOURN SGL QUICK 18X4 (TOURNIQUET CUFF) ×1 IMPLANT
DURAPREP 26ML APPLICATOR (WOUND CARE) ×2 IMPLANT
ELECT REM PT RETURN 9FT ADLT (ELECTROSURGICAL) ×2
ELECTRODE REM PT RTRN 9FT ADLT (ELECTROSURGICAL) ×1 IMPLANT
GAUZE SPONGE 4X4 12PLY STRL (GAUZE/BANDAGES/DRESSINGS) ×2 IMPLANT
GAUZE XEROFORM 1X8 LF (GAUZE/BANDAGES/DRESSINGS) ×2 IMPLANT
GLOVE BIO SURGEON STRL SZ7.5 (GLOVE) ×2 IMPLANT
GLOVE INDICATOR 8.0 STRL GRN (GLOVE) ×2 IMPLANT
GOWN STRL REUS W/ TWL LRG LVL3 (GOWN DISPOSABLE) ×2 IMPLANT
GOWN STRL REUS W/TWL LRG LVL3 (GOWN DISPOSABLE) ×4
KIT TURNOVER KIT A (KITS) ×2 IMPLANT
NDL HYPO 25GX1X1/2 BEV (NEEDLE) IMPLANT
NEEDLE HYPO 25GX1X1/2 BEV (NEEDLE) ×2 IMPLANT
NS IRRIG 500ML POUR BTL (IV SOLUTION) ×1 IMPLANT
PACK EXTREMITY ARMC (MISCELLANEOUS) ×2 IMPLANT
PENCIL SMOKE EVACUATOR (MISCELLANEOUS) ×2 IMPLANT
STOCKINETTE IMPERVIOUS LG (DRAPES) ×2 IMPLANT
STRIP CLOSURE SKIN 1/4X4 (GAUZE/BANDAGES/DRESSINGS) ×2 IMPLANT
SUT ETHILON 4 0 PS 2 18 (SUTURE) ×1 IMPLANT
SUT VIC AB 3-0 SH 27 (SUTURE) ×2
SUT VIC AB 3-0 SH 27X BRD (SUTURE) IMPLANT
SYR 10ML LL (SYRINGE) ×1 IMPLANT
SYR BULB IRRIG 60ML STRL (SYRINGE) ×1 IMPLANT

## 2020-03-21 NOTE — Anesthesia Preprocedure Evaluation (Signed)
Anesthesia Evaluation  Patient identified by MRN, date of birth, ID band Patient awake    Reviewed: Allergy & Precautions, H&P , NPO status , Patient's Chart, lab work & pertinent test results  History of Anesthesia Complications (+) PONV, DIFFICULT AIRWAY and history of anesthetic complications  Airway Mallampati: II  TM Distance: >3 FB Neck ROM: full    Dental no notable dental hx.    Pulmonary neg pulmonary ROS,    Pulmonary exam normal        Cardiovascular negative cardio ROS Normal cardiovascular exam Rhythm:regular Rate:Normal     Neuro/Psych negative neurological ROS  negative psych ROS   GI/Hepatic Neg liver ROS, Medicated,  Endo/Other  Hypothyroidism   Renal/GU   negative genitourinary   Musculoskeletal   Abdominal   Peds  Hematology  (+) Blood dyscrasia, anemia , Previous clot at the end of her port catheter- on eliquis now.   Anesthesia Other Findings   Reproductive/Obstetrics                             Anesthesia Physical Anesthesia Plan  ASA: III  Anesthesia Plan: General   Post-op Pain Management:    Induction:   PONV Risk Score and Plan: 2 and Ondansetron, Propofol infusion and Treatment may vary due to age or medical condition  Airway Management Planned:   Additional Equipment:   Intra-op Plan:   Post-operative Plan:   Informed Consent: I have reviewed the patients History and Physical, chart, labs and discussed the procedure including the risks, benefits and alternatives for the proposed anesthesia with the patient or authorized representative who has indicated his/her understanding and acceptance.       Plan Discussed with:   Anesthesia Plan Comments:         Anesthesia Quick Evaluation

## 2020-03-21 NOTE — Op Note (Signed)
Operative note   Surgeon:Teller Wakefield Lawyer: None    Preop diagnosis: Dislocated right third toe    Postop diagnosis: Same    Procedure: Amputation right third toe MTPJ    EBL: Minimal    Anesthesia:local and IV sedation    Hemostasis: Ankle tourniquet inflated 200 mmHg for 5 minutes    Specimen: Amputated right third toe    Complications: None    Operative indications:Barbara Williamson is an 39 y.o. that presents today for surgical intervention.  The risks/benefits/alternatives/complications have been discussed and consent has been given.    Procedure:  Patient was brought into the OR and placed on the operating table in thesupine position. After anesthesia was obtained theright lower extremity was prepped and draped in usual sterile fashion.  Attention was directed to the right third toe where at the level of the MTPJ and base of the proximal phalanx 2 semielliptical incisions were performed.  Full-thickness flaps were created.  The toe was then disarticulated.  This was sent for pathological examination.  Tourniquet was deflated.  All bleeders were Bovie cauterized.  Layered closure was performed with a 4-0 Vicryl the subcutaneous tissue and a 4-0 nylon for skin.  Bulky sterile dressing was then applied.    Patient tolerated the procedure and anesthesia well.  Was transported from the OR to the PACU with all vital signs stable and vascular status intact. To be discharged per routine protocol.  Will follow up in approximately 1 week in the outpatient clinic.

## 2020-03-21 NOTE — Anesthesia Postprocedure Evaluation (Signed)
Anesthesia Post Note  Patient: Barbara Williamson  Procedure(s) Performed: AMPUTATION TOE MPJ RIGHT 3RD (Right Toe)     Patient location during evaluation: PACU Anesthesia Type: General Level of consciousness: awake and alert Pain management: pain level controlled Vital Signs Assessment: post-procedure vital signs reviewed and stable Respiratory status: spontaneous breathing Cardiovascular status: stable Anesthetic complications: no   No complications documented.  Gillian Scarce

## 2020-03-21 NOTE — H&P (Signed)
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  The various methods of treatment have been discussed with the patient.  No guarantees were given.  After consideration of risks, benefits and other options for treatment, the patient has consented to surgery.  I have reviewed the patients' chart and labs.     A history and physical examination was performed in my office.  The patient was reexamined.  There have been no changes to this history and physical examination.  Barbara Williamson A

## 2020-03-21 NOTE — Anesthesia Procedure Notes (Signed)
Performed by: Gilmar Bua, CRNA Pre-anesthesia Checklist: Patient identified, Emergency Drugs available, Suction available, Timeout performed and Patient being monitored Patient Re-evaluated:Patient Re-evaluated prior to induction Oxygen Delivery Method: Simple face mask Placement Confirmation: positive ETCO2       

## 2020-03-21 NOTE — Transfer of Care (Signed)
Immediate Anesthesia Transfer of Care Note  Patient: Barbara Williamson  Procedure(s) Performed: AMPUTATION TOE MPJ RIGHT 3RD (Right Toe)  Patient Location: PACU  Anesthesia Type: General  Level of Consciousness: awake, alert  and patient cooperative  Airway and Oxygen Therapy: Patient Spontanous Breathing and Patient connected to supplemental oxygen  Post-op Assessment: Post-op Vital signs reviewed, Patient's Cardiovascular Status Stable, Respiratory Function Stable, Patent Airway and No signs of Nausea or vomiting  Post-op Vital Signs: Reviewed and stable  Complications: No complications documented.

## 2020-03-22 ENCOUNTER — Encounter: Payer: Self-pay | Admitting: Podiatry

## 2020-03-23 LAB — SURGICAL PATHOLOGY

## 2020-12-10 ENCOUNTER — Other Ambulatory Visit (HOSPITAL_COMMUNITY): Payer: Self-pay | Admitting: Podiatry

## 2020-12-10 ENCOUNTER — Other Ambulatory Visit: Payer: Self-pay | Admitting: Podiatry

## 2020-12-10 DIAGNOSIS — L97511 Non-pressure chronic ulcer of other part of right foot limited to breakdown of skin: Secondary | ICD-10-CM

## 2020-12-10 DIAGNOSIS — L03115 Cellulitis of right lower limb: Secondary | ICD-10-CM

## 2020-12-18 ENCOUNTER — Ambulatory Visit
Admission: RE | Admit: 2020-12-18 | Discharge: 2020-12-18 | Disposition: A | Discharge status: Home / Self Care | Payer: Managed Care, Other (non HMO) | Source: Ambulatory Visit | Attending: Podiatry | Admitting: Podiatry

## 2020-12-18 ENCOUNTER — Other Ambulatory Visit: Payer: Self-pay

## 2020-12-18 DIAGNOSIS — L03115 Cellulitis of right lower limb: Secondary | ICD-10-CM | POA: Diagnosis present

## 2020-12-18 DIAGNOSIS — L97511 Non-pressure chronic ulcer of other part of right foot limited to breakdown of skin: Secondary | ICD-10-CM | POA: Diagnosis not present

## 2021-01-07 ENCOUNTER — Encounter: Payer: Self-pay | Admitting: Podiatry

## 2021-01-08 ENCOUNTER — Other Ambulatory Visit: Payer: Self-pay | Admitting: Podiatry

## 2021-01-14 NOTE — Anesthesia Preprocedure Evaluation (Addendum)
Anesthesia Evaluation  Patient identified by MRN, date of birth, ID band Patient awake    Reviewed: Allergy & Precautions, NPO status , Patient's Chart, lab work & pertinent test results  History of Anesthesia Complications (+) PONV, DIFFICULT AIRWAY and history of anesthetic complications (small mouth requiring pediatric ETT)  Airway Mallampati: IV   Neck ROM: Limited  Mouth opening: Limited Mouth Opening Comment: Small mouth Dental  (+) Partial Upper   Pulmonary neg pulmonary ROS,    Pulmonary exam normal breath sounds clear to auscultation       Cardiovascular Normal cardiovascular exam Rhythm:Regular Rate:Normal  Hx DVT associated with port, on Eliquis, last dose 01/12/21  Echo 09/14/20:  Normal LV and RV systolic function Mild TR No valvular stenosis No catheter thrombus   Neuro/Psych negative neurological ROS     GI/Hepatic GERD  ,  Endo/Other  Hypothyroidism   Renal/GU Renal disease (nephrolithiasis)     Musculoskeletal  (+) Arthritis , Osteoarthritis and Rheumatoid disorders,  SLE, Raynauds, Sjogrens, gout   Abdominal   Peds  Hematology  (+) Blood dyscrasia, anemia , Hx rhabdomyosarcoma age 40   Anesthesia Other Findings Cardiology note 09/06/20:  1. Dyspnea. Multifactorial. Unchanged. Dyspnea had resolved after initial treatment with the Eliquis; however, this recurred after her COVID illness has been more persistent, but no typical exertional symptoms which can be somewhat variable. She has noticed that her heart rate is up a little bit when this occurs.   Interval issues with nosebleeds that have become a little bit more frequent. On her blood counts, there was note of a little bit of anemia, so there they may be frequent enough to explain some of that.   2. Original issue was the thrombus on the indwelling catheter and the associated dyspnea. The echo in 09/2019 this had nearly resolved.   Otherwise,  overall clinically doing well. She is staying active. She has pretty much recovered from the COVID illness, but still has some moderate issues with taste and smell that have been somewhat variable. Cancer follow-up has been stable. As before, magnesium still low, but stable low. Her potassium had been stabilized. Creatinine relatively stable, although maybe a slight uptake.   Plan:  - Echocardiogram.  - Decreased Eliquis from 5 mg twice daily to 2.5 mg twice daily to see if this will help with her nosebleeds.  - We discussed instituting Gatorade or Pedialyte.  - She is anticipating a visit to the [facility] in reference to her probable lymphedema.  - She will follow-up with oncology.  - Discussed with the patient the importance of 40 minutes of daily activity that can be broken up into smaller intervals and can include things such as exercise stretch bands, light weights, pedaling devices, treadmill, exercise bike, gym work, and outdoor activities.  Follow-up: 1 year.   Reproductive/Obstetrics                            Anesthesia Physical Anesthesia Plan  ASA: 3  Anesthesia Plan: General   Post-op Pain Management:    Induction: Intravenous  PONV Risk Score and Plan: 4 or greater and Ondansetron, Dexamethasone and Treatment may vary due to age or medical condition  Airway Management Planned: LMA  Additional Equipment:   Intra-op Plan:   Post-operative Plan: Extubation in OR  Informed Consent: I have reviewed the patients History and Physical, chart, labs and discussed the procedure including the risks, benefits and alternatives for the  proposed anesthesia with the patient or authorized representative who has indicated his/her understanding and acceptance.       Plan Discussed with: CRNA  Anesthesia Plan Comments:       Anesthesia Quick Evaluation

## 2021-01-15 NOTE — Discharge Instructions (Signed)
[facility]  POST OPERATIVE INSTRUCTIONS FOR DR. FOWLER AND [facility] POD[facility]AT[facility]PART[facility]   Take your medication as prescribed.  Pain medication should be taken only as needed.  Keep the dressing clean, dry and intact.  Keep your foot elevated above the heart level for the first 48 hours.  Walking to the bathroom and brief periods of walking are acceptable, unless we have instructed you to be non-weight bearing.  Always wear your post-op shoe when walking.  Always use your crutches if you are to be non-weight bearing.  Do not take a shower. Baths are permissible as long as the foot is kept out of the water.   Every hour you are awake:  Bend your knee 15 times. Flex foot 15 times Massage calf 15 times  Call [facility] ([phone]) if any of the following problems occur: You develop a temperature or fever. The bandage becomes saturated with blood. Medication does not stop your pain. Injury of the foot occurs. Any symptoms of infection including redness, odor, or red streaks running from wound. 

## 2021-01-16 ENCOUNTER — Ambulatory Visit: Payer: Managed Care, Other (non HMO) | Admitting: Anesthesiology

## 2021-01-16 ENCOUNTER — Ambulatory Visit
Admission: RE | Admit: 2021-01-16 | Discharge: 2021-01-16 | Disposition: A | Discharge status: Home / Self Care | Payer: Managed Care, Other (non HMO) | Attending: Podiatry | Admitting: Podiatry

## 2021-01-16 ENCOUNTER — Other Ambulatory Visit: Payer: Self-pay

## 2021-01-16 ENCOUNTER — Encounter: Payer: Self-pay | Admitting: Podiatry

## 2021-01-16 ENCOUNTER — Encounter
Admission: RE | Disposition: A | Discharge status: Home / Self Care | Payer: Self-pay | Source: Home / Self Care | Attending: Podiatry

## 2021-01-16 DIAGNOSIS — M109 Gout, unspecified: Secondary | ICD-10-CM | POA: Insufficient documentation

## 2021-01-16 DIAGNOSIS — Z833 Family history of diabetes mellitus: Secondary | ICD-10-CM | POA: Insufficient documentation

## 2021-01-16 DIAGNOSIS — X58XXXA Exposure to other specified factors, initial encounter: Secondary | ICD-10-CM | POA: Insufficient documentation

## 2021-01-16 DIAGNOSIS — M67471 Ganglion, right ankle and foot: Secondary | ICD-10-CM | POA: Insufficient documentation

## 2021-01-16 DIAGNOSIS — Z841 Family history of disorders of kidney and ureter: Secondary | ICD-10-CM | POA: Insufficient documentation

## 2021-01-16 DIAGNOSIS — M3500 Sicca syndrome, unspecified: Secondary | ICD-10-CM | POA: Insufficient documentation

## 2021-01-16 DIAGNOSIS — Z803 Family history of malignant neoplasm of breast: Secondary | ICD-10-CM | POA: Insufficient documentation

## 2021-01-16 DIAGNOSIS — Z8249 Family history of ischemic heart disease and other diseases of the circulatory system: Secondary | ICD-10-CM | POA: Insufficient documentation

## 2021-01-16 DIAGNOSIS — S93334A Other dislocation of right foot, initial encounter: Secondary | ICD-10-CM | POA: Insufficient documentation

## 2021-01-16 DIAGNOSIS — L97519 Non-pressure chronic ulcer of other part of right foot with unspecified severity: Secondary | ICD-10-CM | POA: Diagnosis not present

## 2021-01-16 DIAGNOSIS — Y939 Activity, unspecified: Secondary | ICD-10-CM | POA: Diagnosis not present

## 2021-01-16 DIAGNOSIS — Z8616 Personal history of COVID-19: Secondary | ICD-10-CM | POA: Diagnosis not present

## 2021-01-16 DIAGNOSIS — Z79899 Other long term (current) drug therapy: Secondary | ICD-10-CM | POA: Insufficient documentation

## 2021-01-16 HISTORY — PX: WEIL OSTEOTOMY: SHX5044

## 2021-01-16 SURGERY — OSTEOTOMY, WEIL
Anesthesia: General | Site: Foot | Laterality: Right

## 2021-01-16 MED ORDER — BUPIVACAINE HCL (PF) 0.25 % IJ SOLN
INTRAMUSCULAR | Status: DC | PRN
  Administered 2021-01-16: 5 mL

## 2021-01-16 MED ORDER — POVIDONE-IODINE 7.5 % EX SOLN: Freq: Once | CUTANEOUS | Status: DC

## 2021-01-16 MED ORDER — FENTANYL CITRATE (PF) 100 MCG/2ML IJ SOLN
INTRAMUSCULAR | Status: DC | PRN
  Administered 2021-01-16 (×2): 50 ug via INTRAVENOUS

## 2021-01-16 MED ORDER — PHENYLEPHRINE HCL (PRESSORS) 10 MG/ML IV SOLN
INTRAVENOUS | Status: DC | PRN
  Administered 2021-01-16 (×4): 100 ug via INTRAVENOUS

## 2021-01-16 MED ORDER — BUPIVACAINE LIPOSOME 1.3 % IJ SUSP
INTRAMUSCULAR | Status: DC | PRN
  Administered 2021-01-16: 7 mL
  Administered 2021-01-16: 5 mL

## 2021-01-16 MED ORDER — DEXAMETHASONE SODIUM PHOSPHATE 4 MG/ML IJ SOLN
INTRAMUSCULAR | Status: DC | PRN
Start: 2021-01-16 — End: 2021-01-16
  Administered 2021-01-16: 4 mg via INTRAVENOUS

## 2021-01-16 MED ORDER — PROPOFOL 10 MG/ML IV BOLUS
INTRAVENOUS | Status: DC | PRN
Start: 2021-01-16 — End: 2021-01-16
  Administered 2021-01-16: 150 mg via INTRAVENOUS
  Administered 2021-01-16: 50 mg via INTRAVENOUS

## 2021-01-16 MED ORDER — LIDOCAINE HCL (CARDIAC) PF 100 MG/5ML IV SOSY
PREFILLED_SYRINGE | INTRAVENOUS | Status: DC | PRN
  Administered 2021-01-16: 40 mg via INTRAVENOUS

## 2021-01-16 MED ORDER — CLINDAMYCIN PHOSPHATE 600 MG/50ML IV SOLN
600.0000 mg | INTRAVENOUS | Status: AC
  Administered 2021-01-16: 600 mg via INTRAVENOUS

## 2021-01-16 MED ORDER — DOXYCYCLINE HYCLATE 100 MG PO TABS: 100.0000 mg | ORAL_TABLET | Freq: Two times a day (BID) | ORAL | 0 refills | Status: AC

## 2021-01-16 MED ORDER — SODIUM CHLORIDE 0.9 % IR SOLN
Status: DC | PRN
  Administered 2021-01-16: 500 mL

## 2021-01-16 MED ORDER — ONDANSETRON HCL 4 MG/2ML IJ SOLN: 4.0000 mg | Freq: Once | INTRAMUSCULAR | Status: DC | PRN

## 2021-01-16 MED ORDER — ONDANSETRON HCL 4 MG/2ML IJ SOLN
INTRAMUSCULAR | Status: DC | PRN
  Administered 2021-01-16: 4 mg via INTRAVENOUS

## 2021-01-16 MED ORDER — MIDAZOLAM HCL 5 MG/5ML IJ SOLN
INTRAMUSCULAR | Status: DC | PRN
  Administered 2021-01-16: 2 mg via INTRAVENOUS

## 2021-01-16 MED ORDER — LACTATED RINGERS IV SOLN: INTRAVENOUS | Status: DC

## 2021-01-16 MED ORDER — GLYCOPYRROLATE 0.2 MG/ML IJ SOLN
INTRAMUSCULAR | Status: DC | PRN
  Administered 2021-01-16: .1 mg via INTRAVENOUS

## 2021-01-16 MED ORDER — ACETAMINOPHEN 10 MG/ML IV SOLN: 1000.0000 mg | Freq: Once | INTRAVENOUS | Status: DC | PRN

## 2021-01-16 MED ORDER — FENTANYL CITRATE PF 50 MCG/ML IJ SOSY: 25.0000 ug | PREFILLED_SYRINGE | INTRAMUSCULAR | Status: DC | PRN

## 2021-01-16 MED ORDER — TRAMADOL HCL 50 MG PO TABS: 50.0000 mg | ORAL_TABLET | Freq: Four times a day (QID) | ORAL | 0 refills | Status: AC | PRN

## 2021-01-16 SURGICAL SUPPLY — 29 items
APL SKNCLS STERI-STRIP NONHPOA (GAUZE/BANDAGES/DRESSINGS) ×1
BENZOIN TINCTURE PRP APPL 2/3 (GAUZE/BANDAGES/DRESSINGS) ×2 IMPLANT
BLADE OSC/SAGITTAL 5.5X25 (BLADE) ×2 IMPLANT
BNDG CMPR 75X41 PLY HI ABS (GAUZE/BANDAGES/DRESSINGS) ×1
BNDG COHESIVE 4X5 TAN ST LF (GAUZE/BANDAGES/DRESSINGS) ×2 IMPLANT
BNDG CONFORM 2 STRL LF (GAUZE/BANDAGES/DRESSINGS) ×2 IMPLANT
BNDG ELASTIC 4X5.8 VLCR STR LF (GAUZE/BANDAGES/DRESSINGS) ×2 IMPLANT
BNDG ESMARK 4X12 TAN STRL LF (GAUZE/BANDAGES/DRESSINGS) ×2 IMPLANT
BNDG GAUZE ELAST 4 BULKY (GAUZE/BANDAGES/DRESSINGS) ×4 IMPLANT
BNDG STRETCH 4X75 STRL LF (GAUZE/BANDAGES/DRESSINGS) ×2 IMPLANT
CANISTER SUCT 1200ML W/VALVE (MISCELLANEOUS) ×2 IMPLANT
COVER LIGHT HANDLE UNIVERSAL (MISCELLANEOUS) ×4 IMPLANT
DRAPE FLUOR MINI C-ARM 54X84 (DRAPES) ×2 IMPLANT
DURAPREP 26ML APPLICATOR (WOUND CARE) ×2 IMPLANT
ELECT REM PT RETURN 9FT ADLT (ELECTROSURGICAL) ×2
ELECTRODE REM PT RTRN 9FT ADLT (ELECTROSURGICAL) ×1 IMPLANT
GAUZE SPONGE 4X4 12PLY STRL (GAUZE/BANDAGES/DRESSINGS) ×6 IMPLANT
GAUZE XEROFORM 1X8 LF (GAUZE/BANDAGES/DRESSINGS) ×2 IMPLANT
GLOVE SRG 8 PF TXTR STRL LF DI (GLOVE) ×2 IMPLANT
GLOVE SURG UNDER POLY LF SZ8 (GLOVE) ×4
GOWN STRL REUS W/ TWL LRG LVL3 (GOWN DISPOSABLE) ×2 IMPLANT
GOWN STRL REUS W/TWL LRG LVL3 (GOWN DISPOSABLE) ×4
K-Wire 1.1 mm x 152mm (Wire) ×2 IMPLANT
KIT TURNOVER KIT A (KITS) ×2 IMPLANT
NS IRRIG 500ML POUR BTL (IV SOLUTION) ×2 IMPLANT
PACK EXTREMITY ARMC (MISCELLANEOUS) ×2 IMPLANT
STOCKINETTE IMPERVIOUS LG (DRAPES) ×2 IMPLANT
STRIP CLOSURE SKIN 1/4X4 (GAUZE/BANDAGES/DRESSINGS) ×2 IMPLANT
SUT VIC AB 4-0 FS2 27 (SUTURE) ×2 IMPLANT

## 2021-01-16 NOTE — H&P (Signed)
HISTORY AND PHYSICAL INTERVAL NOTE:  01/16/2021  2:20 PM  Barbara Williamson  has presented today for surgery, with the diagnosis of L03.115- Cellulitis of right foot L97.511- Ulcer of right foot.  The various methods of treatment have been discussed with the patient.  No guarantees were given.  After consideration of risks, benefits and other options for treatment, the patient has consented to surgery.  I have reviewed the patients' chart and labs.     A history and physical examination was performed in my office.  The patient was reexamined.  There have been no changes to this history and physical examination.  [name]

## 2021-01-16 NOTE — Anesthesia Postprocedure Evaluation (Signed)
Anesthesia Post Note  Patient: Barbara Williamson  Procedure(s) Performed: WEIL OSTEOTOMY- RT 3RD METATARSAL (Right: Foot)     Patient location during evaluation: PACU Anesthesia Type: General Level of consciousness: awake and alert, oriented and patient cooperative Pain management: pain level controlled Vital Signs Assessment: post-procedure vital signs reviewed and stable Respiratory status: spontaneous breathing, nonlabored ventilation and respiratory function stable Cardiovascular status: blood pressure returned to baseline and stable Postop Assessment: adequate PO intake Anesthetic complications: no   No notable events documented.  Darrin Nipper

## 2021-01-16 NOTE — Anesthesia Procedure Notes (Signed)
Procedure Name: LMA Insertion Date/Time: 01/16/2021 2:54 PM Performed by: Silvana Newness, CRNA Pre-anesthesia Checklist: Patient identified, Emergency Drugs available, Suction available, Patient being monitored and Timeout performed Patient Re-evaluated:Patient Re-evaluated prior to induction Oxygen Delivery Method: Circle system utilized Preoxygenation: Pre-oxygenation with 100% oxygen Induction Type: IV induction Ventilation: Mask ventilation without difficulty LMA: LMA inserted LMA Size: 3.0 Number of attempts: 1 Placement Confirmation: positive ETCO2 and breath sounds checked- equal and bilateral Dental Injury: Teeth and Oropharynx as per pre-operative assessment  Comments: Small mouth opening, LMA 3 deflated completely to get in mouth.  Smooth placement

## 2021-01-16 NOTE — Op Note (Signed)
Operative note   Surgeon:Prince Couey Lawyer: None    Preop diagnosis: Plantar displaced third metatarsal with ulceration right foot    Postop diagnosis: Same    Procedure: 1.Weil osteotomy right third metatarsal 2.  Excisional debridement ulcer to level of subcutaneous tissue right foot    EBL: Minimal    Anesthesia:local and general local consisted of 5 cc of 0.25% bupivacaine and 12 cc of Exparel long-acting anesthetic    Hemostasis: None    Specimen: Deep wound culture as well as soft tissue specimen around the third MTPJ    Complications: None    Operative indications:Barbara Williamson is an 40 y.o. that presents today for surgical intervention.  The risks/benefits/alternatives/complications have been discussed and consent has been given.    Procedure:  Patient was brought into the OR and placed on the operating table in thesupine position. After anesthesia was obtained theright lower extremity was prepped and draped in usual sterile fashion.  Attention was directed to the dorsal aspect of the third metatarsal where longitudinal incision was performed.  Sharp and blunt dissection carried down to the extensor capsule region.  Incision was performed overlying the capsular tissue and periosteum.  Time the joint was explored.  No signs of purulent infection was noted.  There was noted to be a cystic type of mass at the distal aspect of the third metatarsal head that seem to connect to the draining wound to the plantar aspect of the foot.  This was excised and sent for pathological examination.  The wound was then flushed with copious amounts of irrigation.  Next a Weil type osteotomy was created from distal to proximal at about the level of the surgical neck.  A dorsal wedge was then removed from this site.  The metatarsal head was then allowed to shorten into a better anatomically aligned position and noted to be dorsiflexed at this time.  At this time a smooth K wire was placed  percutaneously through the skin crossing the osteotomy site and ending at the level of the metatarsal head.  Good stability was noted.  The wound was flushed once again with copious amounts of irrigation.  Layered closure was then performed with a 3-0 Vicryl for the subcutaneous tissue and skin was closed with a 4-0 nylon.  A bulky sterile dressing was applied.  The ulcerative site to the plantar aspect of the third metatarsal head was then debrided with a 15 blade down to the level of subcutaneous tissue.  Predebridement measurements were 3 x 3 mm with postdebridement measurements of 5 x 6 mm with a depth down into the subcutaneous tissue.  A bulky sterile dressing was applied to the distal foot.    Patient tolerated the procedure and anesthesia well.  Was transported from the OR to the PACU with all vital signs stable and vascular status intact. To be discharged per routine protocol.  Will follow up in approximately 1 week in the outpatient clinic.

## 2021-01-16 NOTE — Transfer of Care (Signed)
Immediate Anesthesia Transfer of Care Note  Patient: Barbara Williamson  Procedure(s) Performed: WEIL OSTEOTOMY- RT 3RD METATARSAL (Right: Foot)  Patient Location: PACU  Anesthesia Type: General  Level of Consciousness: awake, alert  and patient cooperative  Airway and Oxygen Therapy: Patient Spontanous Breathing and Patient connected to supplemental oxygen  Post-op Assessment: Post-op Vital signs reviewed, Patient's Cardiovascular Status Stable, Respiratory Function Stable, Patent Airway and No signs of Nausea or vomiting  Post-op Vital Signs: Reviewed and stable  Complications: No notable events documented.

## 2021-01-17 ENCOUNTER — Encounter: Payer: Self-pay | Admitting: Podiatry

## 2021-01-18 LAB — SURGICAL PATHOLOGY

## 2021-01-22 LAB — AEROBIC/ANAEROBIC CULTURE W GRAM STAIN (SURGICAL/DEEP WOUND): Culture: NORMAL

## 2022-12-16 ENCOUNTER — Other Ambulatory Visit: Payer: Self-pay | Admitting: Internal Medicine

## 2022-12-16 DIAGNOSIS — M5442 Lumbago with sciatica, left side: Secondary | ICD-10-CM

## 2022-12-27 ENCOUNTER — Ambulatory Visit
Admission: RE | Admit: 2022-12-27 | Discharge: 2022-12-27 | Disposition: A | Discharge status: Home / Self Care | Payer: Managed Care, Other (non HMO) | Source: Ambulatory Visit | Attending: Internal Medicine | Admitting: Internal Medicine

## 2022-12-27 DIAGNOSIS — M5442 Lumbago with sciatica, left side: Secondary | ICD-10-CM

## 2023-01-02 ENCOUNTER — Other Ambulatory Visit: Payer: Self-pay | Admitting: Internal Medicine

## 2023-01-02 DIAGNOSIS — S3210XS Unspecified fracture of sacrum, sequela: Secondary | ICD-10-CM

## 2023-01-03 IMAGING — MR MR FOOT*R* W/O CM
5 series · 40 of 40 positions shown · non-contrast
Comparison: None.

CLINICAL DATA: Callus on ball of foot for 1 month. History of third
toe amputation on 03/21/2020

EXAM:
MRI OF THE RIGHT FOREFOOT WITHOUT CONTRAST
TECHNIQUE: Multiplanar, multisequence MR imaging of the right forefoot was
performed. No intravenous contrast was administered.

[Series 4: T1 · coronal · right · 3.0mm · 0.38mm/px · 12 of 44 slices shown (1 of 2)]
[im 1/44]
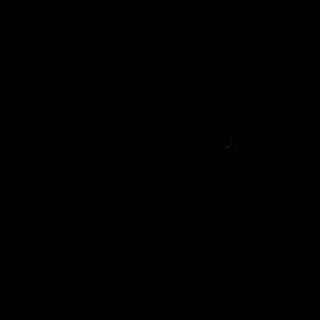
[im 4/44]
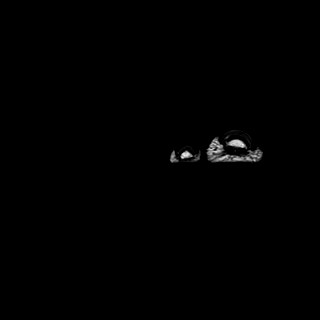
[im 8/44]
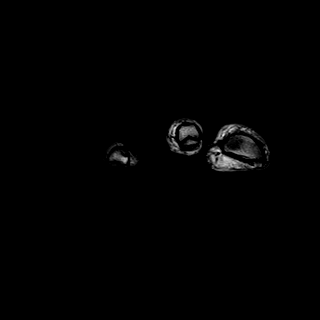
[im 12/44]
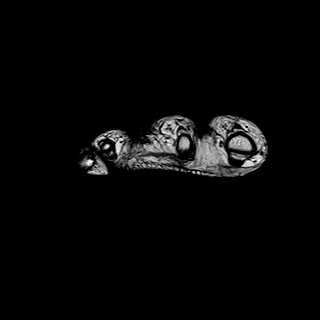
[im 16/44]
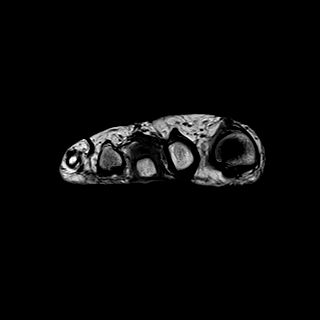
[im 20/44]
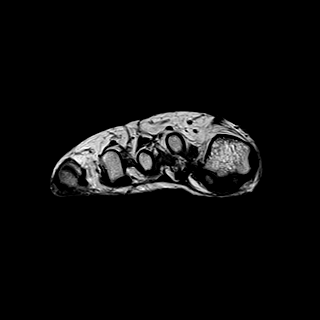
[im 24/44]
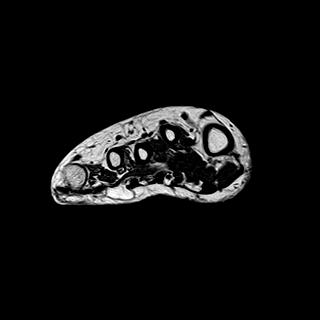
[im 28/44]
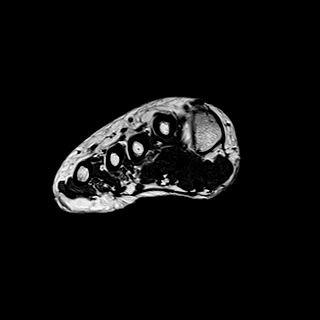
[im 32/44]
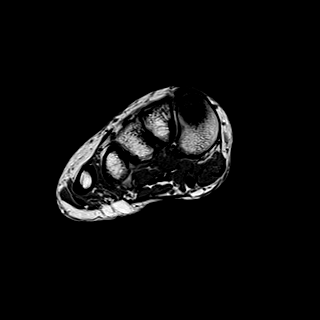
[im 36/44]
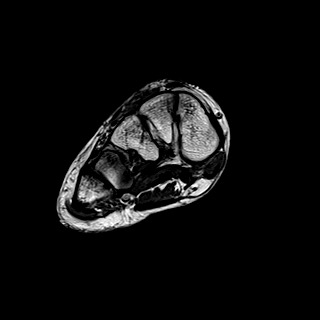
[im 40/44]
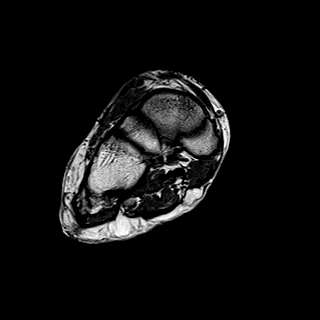
[im 44/44]
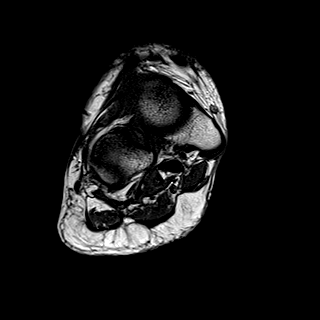

[Series 6: T2 · coronal · right · 3.0mm · 0.50mm/px · 12 of 44 slices shown (1 of 2)]
[im 1/44]
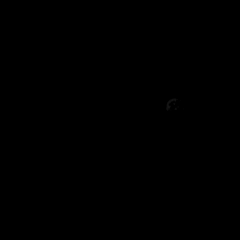
[im 4/44]
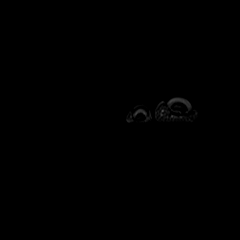
[im 8/44]
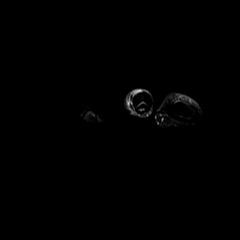
[im 12/44]
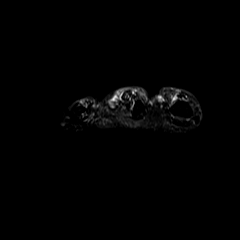
[im 16/44]
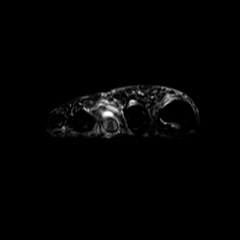
[im 20/44]
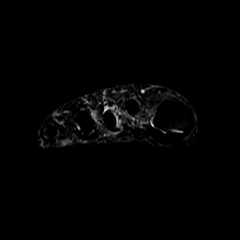
[im 24/44]
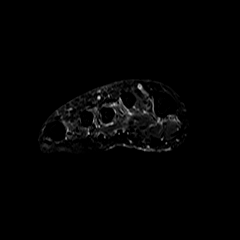
[im 28/44]
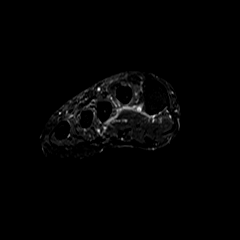
[im 32/44]
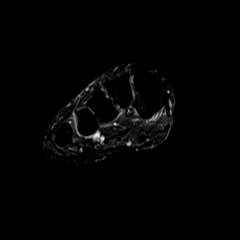
[im 36/44]
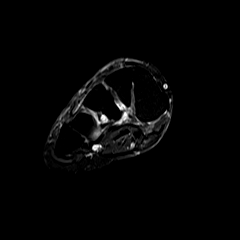
[im 40/44]
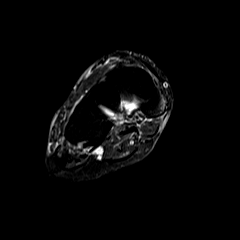
[im 44/44]
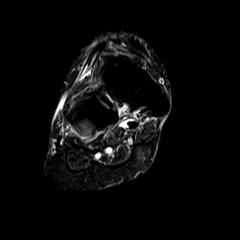

[Series 7: T1 · axial · right · 3.0mm · 0.70mm/px · z∈[-99,-48]mm · 4 of 16 slices shown (2 of 2)]
[im 1/16]
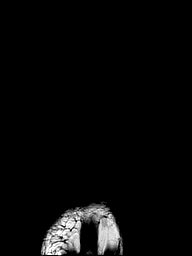
[im 6/16]
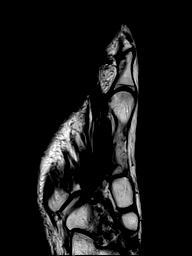
[im 11/16]
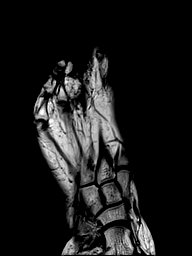
[im 16/16]
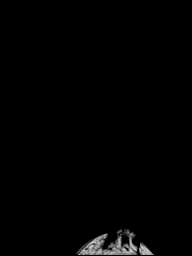

[Series 9: T2 · axial · right · 3.0mm · 0.70mm/px · z∈[-99,-48]mm · 4 of 16 slices shown (2 of 2)]
[im 1/16]
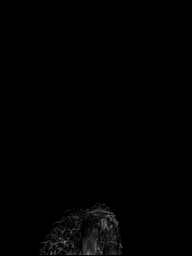
[im 6/16]
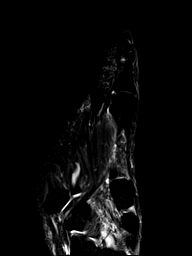
[im 11/16]
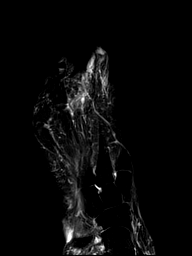
[im 16/16]
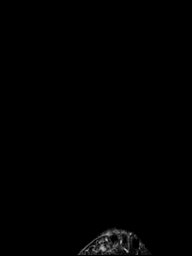

[Series 10: STIR · sagittal · right · 3.0mm · 0.62mm/px · 8 of 28 slices shown]
[im 1/28]
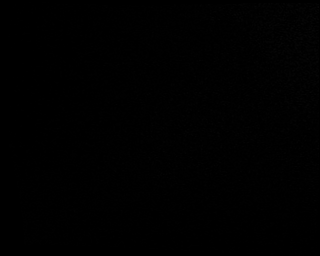
[im 4/28]
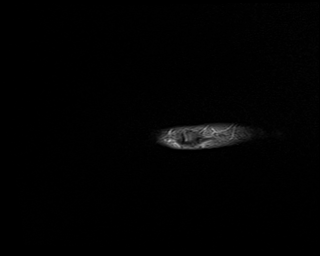
[im 8/28]
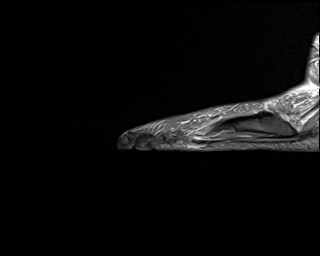
[im 12/28]
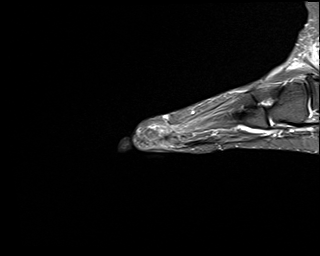
[im 16/28]
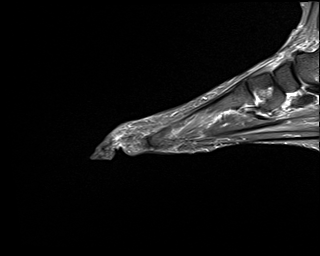
[im 20/28]
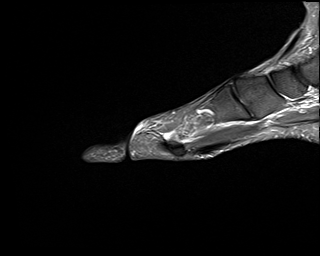
[im 24/28]
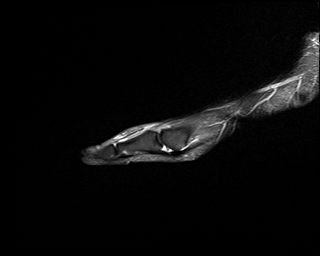
[im 28/28]
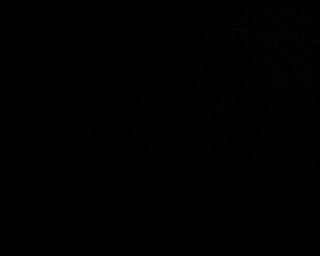

[40 of 40 positions shown; findings below may reference images not displayed]

FINDINGS: Bones/Joint/Cartilage

Status post amputation of the third toe at the third MTP joint
level. There is bone marrow edema within the third metatarsal head
(series 9, images 8-9). Fatty T1 bone marrow signal is maintained.
The third metatarsal head is inferiorly located relative to the
adjacent metatarsal heads and closely approximates the plantar skin
surface (series 4, image 17). Prominent low T1 signal soft tissue
thickening circumferentially surrounding the third metatarsal head
with fluid collection along the dorsal margin measuring 9 x 5 x 6
mm.

Metatarsus quintus varus deformity. Osseous structures appear
otherwise unremarkable. No evidence of acute fracture or
dislocation. Joint spaces are preserved. No significant arthropathy.

Ligaments

Intact Lisfranc ligament. Remaining collateral ligaments of the
forefoot appear intact.

Muscles and Tendons

Amputation changes of the flexor and extensor tendons of the third
toe. Remaining tendinous structures intact. No tenosynovitis. Normal
muscle bulk and signal intensity.

Soft tissues

Surgical incision site noted dorsally to the third toe. Trace first
intermetatarsal bursal fluid.
IMPRESSION: 1. Status post amputation of the third toe at the third MTP joint
level. The third metatarsal head is inferiorly located relative to
the adjacent metatarsal heads and closely approximates the plantar
skin surface. Bone marrow edema within the third metatarsal head
with preserved fatty T1 signal. Findings may reflect
contusion/trauma. Reactive osteitis in the setting of infection is
also a consideration. Clinical correlation for signs of infection
including serum inflammatory markers is recommended.
2. Prominent soft tissue thickening/fibrosis circumferentially
surrounding the third metatarsal head. Small fluid collection along
the dorsal margin of the third metatarsal head measuring 9 x 5 x 6
mm, could represent a small ganglion cyst, adventitial bursa, or
abscess.
3. Metatarsus quintus varus deformity.

## 2023-01-05 ENCOUNTER — Ambulatory Visit
Admission: RE | Admit: 2023-01-05 | Discharge: 2023-01-05 | Discharge status: Home / Self Care | Payer: Managed Care, Other (non HMO) | Source: Ambulatory Visit | Attending: Internal Medicine | Admitting: Internal Medicine

## 2023-01-05 ENCOUNTER — Other Ambulatory Visit: Payer: Self-pay | Admitting: Internal Medicine

## 2023-01-05 DIAGNOSIS — S3210XS Unspecified fracture of sacrum, sequela: Secondary | ICD-10-CM

## 2023-01-06 ENCOUNTER — Other Ambulatory Visit: Payer: Self-pay | Admitting: Internal Medicine

## 2023-01-06 DIAGNOSIS — M8448XA Pathological fracture, other site, initial encounter for fracture: Secondary | ICD-10-CM

## 2023-01-07 ENCOUNTER — Ambulatory Visit
Admission: RE | Admit: 2023-01-07 | Discharge: 2023-01-07 | Disposition: A | Discharge status: Home / Self Care | Payer: Managed Care, Other (non HMO) | Source: Ambulatory Visit | Attending: Internal Medicine | Admitting: Internal Medicine

## 2023-01-07 DIAGNOSIS — S3210XS Unspecified fracture of sacrum, sequela: Secondary | ICD-10-CM

## 2023-01-08 ENCOUNTER — Ambulatory Visit
Admission: RE | Admit: 2023-01-08 | Discharge: 2023-01-08 | Disposition: A | Discharge status: Home / Self Care | Payer: Managed Care, Other (non HMO) | Source: Ambulatory Visit | Attending: Internal Medicine | Admitting: Internal Medicine

## 2023-01-08 DIAGNOSIS — M8448XA Pathological fracture, other site, initial encounter for fracture: Secondary | ICD-10-CM

## 2023-01-08 HISTORY — PX: IR RADIOLOGIST EVAL & MGMT: IMG5224

## 2023-01-08 NOTE — H&P (Signed)
Interventional Radiology - Clinic Visit, Initial H&P    Referring Provider: Enid Baas, MD  Reason for Visit: Sacral insuffiencey fractures    History of Present Illness  Barbara Williamson is a 42 y.o. female with a relevant past medical history of osteopenia (DEXA March 2024) seen today in Interventional Radiology clinic for further evaluation and management of bilateral sacral insuffiencey fractures.  The patient reports a gradual onset of lower back and hip pain, left-greater-than-right, radiating into the hip and buttocks in the beginning of June.  She does not recall any associated trauma to the area.  Despite conservative measures, for pain persisted and ultimately an MRI of the sacrum was performed on January 05, 2023 demonstrating bilateral subacute sacral fractures, most likely insufficiency fractures.  Due to progressive pelvic pain, a CT of the pelvis was performed on January 07, 2023, demonstrating additional subacute/healing fractures of the left superior and inferior pubic rami.    The patient reports Foley constant severe 8/10 lower back and hip/buttock pain.  She is taking both gabapentin and hydrocodone for pain.  There is mild improvement in her pain to 5/10 with the medication.  She rates significant disability on the [employer] disability questionnaire with 17/24 positive.  She works as a Occupational psychologist, and is limited in her ability to stand for prolonged periods of time and carry heavy objects.    Past medical history significant for osteopenia, most recently diagnosed on DEXA scan in March 2024.    Additional Past Medical History Past Medical History:  Diagnosis Date   Anemia    Arthritis    rheumatoid-wrists, knees and ankles   Cancer (HCC) 1986   rhabdomyosarcoma-42 yrs old   COVID-19 07/05/2019   Difficult intubation    requires pediatric tube, small mouth and airway   Endometritis    Fibroids    GERD (gastroesophageal reflux disease)     Gout    History of kidney stones    Hypothyroidism    Had thyroid removed due to nodule   Lupus (HCC)    PONV (postoperative nausea and vomiting)    Raynaud's disease    Sicca syndrome, Sjogren's (HCC)    Sinusitis    chronic maxillary, allergic   Wears dentures    upper, very snug fitting     Surgical History  Past Surgical History:  Procedure Laterality Date   ABDOMINAL HYSTERECTOMY  03/29/2013   robot assisted total laparoscopic   AMPUTATION TOE Right 03/21/2020   Procedure: AMPUTATION TOE MPJ RIGHT 3RD;  Surgeon: Gwyneth Revels, DPM;  Location: [facility];  Service: Podiatry;  Laterality: Right;   CARPAL TUNNEL RELEASE Right 2009   CATARACT EXTRACTION W/ INTRAOCULAR LENS IMPLANT Left 1989   COLONOSCOPY     ESOPHAGOGASTRODUODENOSCOPY     KNEE DISLOCATION SURGERY Right    X 2   LAPAROSCOPY ABDOMEN DIAGNOSTIC     LESION REMOVAL Right 01/06/2019   arm lesion   LITHOTRIPSY     MAXILLARY SINUS LIFT     x2   SINUS EXPLORATION Left    x 2   TOTAL THYROIDECTOMY Bilateral 07/16/2015   TUMOR REMOVAL Left 1986   rhabdomyosarcoma   WEIL OSTEOTOMY Right 01/16/2021   Procedure: WEIL OSTEOTOMY- RT 3RD METATARSAL;  Surgeon: Gwyneth Revels, DPM;  Location: [facility];  Service: Podiatry;  Laterality: Right;  needs Port-a-cath accessed     Medications  I have reviewed the current medication list. Refer to chart for details. Current Outpatient Medications  Medication Instructions   amLODipine (NORVASC) 5 mg, Oral, Daily   apixaban (ELIQUIS) 2.5 mg, Oral, 2 times daily   Cholecalciferol (VITAMIN D3) 25 MCG (1000 UT) CAPS Oral, Daily   doxycycline (VIBRA-TABS) 100 mg, 2 times daily   gabapentin (NEURONTIN) 300 mg, Oral, 3 times daily   hydroxychloroquine (PLAQUENIL) 200 mg, Oral, Daily   leflunomide (ARAVA) 10 mg, Every evening   levothyroxine (SYNTHROID) 112 mcg, Oral, Daily before breakfast   magnesium oxide (MAG-OX) 1,200 mg, Oral, 2 times daily    norethindrone (AYGESTIN) 5 mg, Oral, Daily   omeprazole (PRILOSEC) 20 mg, Oral, Daily   oxyCODONE-acetaminophen (PERCOCET) 10-325 MG tablet 1 tablet, Oral, Every 4 hours PRN   potassium chloride SA (KLOR-CON) 20 MEQ tablet 40 mEq, Oral, Daily   potassium citrate (UROCIT-K) 10 MEQ (1080 MG) SR tablet 20 mEq, 2 times daily   sodium bicarbonate 650 mg, Oral, 2 times daily   spironolactone (ALDACTONE) 25 mg, Oral, Daily   sulfaDIAZINE 1,000 mg, 2 times daily   Tofacitinib Citrate ER (XELJANZ XR) 11 MG TB24 Oral      Allergies Allergies  Allergen Reactions   Augmentin [Amoxicillin-Pot Clavulanate] Nausea And Vomiting   Codeine Nausea And Vomiting   Oxycodone Nausea And Vomiting   Does patient have contrast allergy: No     Physical Exam Current Vitals   ( )                    There is no height or weight on file to calculate BMI.  General: Alert and answers questions appropriately. No apparent distress. HEENT: Scar along left neck.  Cardiac: Regular rate. No dependent edema. Pulmonary: Normal work of breathing. On room air. Abdominal: Soft without distension. Extremities: Normally-formed, well perfused.    Pertinent Lab Results    Latest Ref Rng & Units 03/02/2013   12:30 AM 02/25/2013    5:30 AM 02/23/2013    7:58 PM  CBC  WBC 4.0 - 10.5 K/uL 9.1  9.1  11.9   Hemoglobin 12.0 - 15.0 g/dL 40.3  47.4  25.9   Hematocrit 36.0 - 46.0 % 40.8  36.1  46.7   Platelets 150 - 400 K/uL 170  155  266       Latest Ref Rng & Units 03/02/2013   12:30 AM 02/23/2013    7:58 PM  CMP  Glucose 70 - 99 mg/dL 96  88   BUN 6 - 23 mg/dL 18  22   Creatinine 5.63 - 1.10 mg/dL 8.75  6.43   Sodium 329 - 145 mEq/L 139  137   Potassium 3.5 - 5.1 mEq/L 4.2  3.6   Chloride 96 - 112 mEq/L 102  104   CO2 19 - 32 mEq/L 23  25   Calcium 8.4 - 10.5 mg/dL 9.1  9.3       Relevant and/or Recent Imaging: MRI sacrum 01/05/2023  IMPRESSION: Subacute to late subacute bilateral sacral fractures.  The left sacral fracture appears more recent. The fractures have an appearance most consistent with insufficiency or posttraumatic injuries. Electronically Signed By: Drusilla Kanner M.D. On: 01/05/2023 10:55    CT pelvis 01/07/2023  IMPRESSION: Probable subacute fractures seen involving the left superior and inferior pubic rami as well as the left sacral ala. Electronically Signed By: Lupita Raider M.D. On: 01/07/2023 13:26      Assessment & Plan:   Patient has suffered subacute insufficiency fractures of the bilateral sacral ala. Given her history of osteopenia  and ongoing pain limiting her ability to work, she is appropriate candidate for bilateral sacroplasty.     Plan:  Bilateral sacroplasty   Post-procedure disposition: outpatient DRI-A  Medication holds: TBD   The patient has suffered a fracture of the sacrum. It is recommended that patients aged 59 years or older be evaluated for possible testing or treatment of osteoporosis. A copy of this consult report is sent to the patient's referring physician.  Advanced Care Plan: The patient did not want to provide an Advanced Care Plan at the time of this visit     Total time spent on today's visit was over 40 Minutes, including both face-to-face time and non face-to-face time, personally spent on review of chart (including labs and relevant imaging), discussing further workup and treatment options, referral to specialist if needed, reviewing outside records if pertinent, answering patient questions, and coordinating care regarding sacral fractures as well as management strategy.       Olive Bass, MD  Vascular and Interventional Radiology 01/08/2023 2:54 PM
